# Patient Record
Sex: Female | Born: 1955 | Race: White | Hispanic: No | Smoking: Never smoker
Health system: Southern US, Community
[De-identification: ages and names within clinical notes are randomized; demographics above are authoritative.]

## PROBLEM LIST (undated history)

## (undated) DIAGNOSIS — D219 Benign neoplasm of connective and other soft tissue, unspecified: Secondary | ICD-10-CM

## (undated) DIAGNOSIS — N390 Urinary tract infection, site not specified: Secondary | ICD-10-CM

## (undated) DIAGNOSIS — M1990 Unspecified osteoarthritis, unspecified site: Secondary | ICD-10-CM

## (undated) DIAGNOSIS — S6991XA Unspecified injury of right wrist, hand and finger(s), initial encounter: Secondary | ICD-10-CM

## (undated) DIAGNOSIS — I471 Supraventricular tachycardia, unspecified: Secondary | ICD-10-CM

## (undated) DIAGNOSIS — N329 Bladder disorder, unspecified: Secondary | ICD-10-CM

## (undated) HISTORY — DX: Benign neoplasm of connective and other soft tissue, unspecified: D21.9

## (undated) HISTORY — PX: COLONOSCOPY: SHX174

## (undated) HISTORY — DX: Supraventricular tachycardia: I47.1

## (undated) HISTORY — DX: Urinary tract infection, site not specified: N39.0

## (undated) HISTORY — DX: Supraventricular tachycardia, unspecified: I47.10

---

## 1989-06-06 HISTORY — PX: SALPINGOOPHORECTOMY: SHX82

## 1998-08-27 ENCOUNTER — Other Ambulatory Visit: Admission: RE | Admit: 1998-08-27 | Discharge: 1998-08-27 | Payer: Self-pay | Admitting: Obstetrics and Gynecology

## 1999-10-11 ENCOUNTER — Other Ambulatory Visit: Admission: RE | Admit: 1999-10-11 | Discharge: 1999-10-11 | Payer: Self-pay | Admitting: Obstetrics and Gynecology

## 1999-10-22 ENCOUNTER — Encounter: Payer: Self-pay | Admitting: Obstetrics and Gynecology

## 1999-10-22 ENCOUNTER — Encounter: Admission: RE | Admit: 1999-10-22 | Discharge: 1999-10-22 | Payer: Self-pay | Admitting: Obstetrics and Gynecology

## 2000-11-27 ENCOUNTER — Encounter: Admission: RE | Admit: 2000-11-27 | Discharge: 2000-11-27 | Payer: Self-pay | Admitting: Obstetrics and Gynecology

## 2000-11-27 ENCOUNTER — Encounter: Payer: Self-pay | Admitting: Obstetrics and Gynecology

## 2000-11-30 ENCOUNTER — Encounter: Payer: Self-pay | Admitting: Obstetrics and Gynecology

## 2000-11-30 ENCOUNTER — Encounter: Admission: RE | Admit: 2000-11-30 | Discharge: 2000-11-30 | Payer: Self-pay | Admitting: Obstetrics and Gynecology

## 2001-02-12 ENCOUNTER — Other Ambulatory Visit: Admission: RE | Admit: 2001-02-12 | Discharge: 2001-02-12 | Payer: Self-pay | Admitting: Obstetrics and Gynecology

## 2002-01-24 ENCOUNTER — Encounter: Admission: RE | Admit: 2002-01-24 | Discharge: 2002-01-24 | Payer: Self-pay | Admitting: Obstetrics and Gynecology

## 2002-01-24 ENCOUNTER — Encounter: Payer: Self-pay | Admitting: Obstetrics and Gynecology

## 2002-11-13 ENCOUNTER — Other Ambulatory Visit: Admission: RE | Admit: 2002-11-13 | Discharge: 2002-11-13 | Payer: Self-pay | Admitting: Obstetrics and Gynecology

## 2003-11-05 ENCOUNTER — Encounter: Admission: RE | Admit: 2003-11-05 | Discharge: 2003-11-05 | Payer: Self-pay | Admitting: Obstetrics and Gynecology

## 2004-02-12 ENCOUNTER — Other Ambulatory Visit: Admission: RE | Admit: 2004-02-12 | Discharge: 2004-02-12 | Payer: Self-pay | Admitting: *Deleted

## 2004-05-03 ENCOUNTER — Ambulatory Visit: Payer: Self-pay | Admitting: Family Medicine

## 2005-01-04 ENCOUNTER — Encounter: Admission: RE | Admit: 2005-01-04 | Discharge: 2005-01-04 | Payer: Self-pay | Admitting: *Deleted

## 2005-02-14 ENCOUNTER — Ambulatory Visit: Payer: Self-pay | Admitting: Family Medicine

## 2005-02-17 ENCOUNTER — Ambulatory Visit: Payer: Self-pay | Admitting: Internal Medicine

## 2005-03-01 ENCOUNTER — Ambulatory Visit (HOSPITAL_COMMUNITY): Admission: RE | Admit: 2005-03-01 | Discharge: 2005-03-01 | Payer: Self-pay | Admitting: Urology

## 2005-06-03 ENCOUNTER — Other Ambulatory Visit: Admission: RE | Admit: 2005-06-03 | Discharge: 2005-06-03 | Payer: Self-pay | Admitting: Obstetrics and Gynecology

## 2006-03-14 ENCOUNTER — Encounter: Admission: RE | Admit: 2006-03-14 | Discharge: 2006-03-14 | Payer: Self-pay | Admitting: Obstetrics & Gynecology

## 2006-04-14 ENCOUNTER — Ambulatory Visit (HOSPITAL_COMMUNITY): Admission: RE | Admit: 2006-04-14 | Discharge: 2006-04-14 | Payer: Self-pay | Admitting: Obstetrics & Gynecology

## 2006-04-26 ENCOUNTER — Ambulatory Visit: Payer: Self-pay | Admitting: Family Medicine

## 2006-06-08 ENCOUNTER — Other Ambulatory Visit: Admission: RE | Admit: 2006-06-08 | Discharge: 2006-06-08 | Payer: Self-pay | Admitting: Obstetrics & Gynecology

## 2006-06-23 ENCOUNTER — Ambulatory Visit (HOSPITAL_COMMUNITY): Admission: RE | Admit: 2006-06-23 | Discharge: 2006-06-23 | Payer: Self-pay | Admitting: Gastroenterology

## 2007-02-08 ENCOUNTER — Telehealth: Payer: Self-pay | Admitting: Family Medicine

## 2007-05-01 ENCOUNTER — Encounter: Admission: RE | Admit: 2007-05-01 | Discharge: 2007-05-01 | Payer: Self-pay | Admitting: Obstetrics & Gynecology

## 2007-06-26 ENCOUNTER — Ambulatory Visit: Payer: Self-pay | Admitting: Family Medicine

## 2007-06-26 DIAGNOSIS — Z87448 Personal history of other diseases of urinary system: Secondary | ICD-10-CM | POA: Insufficient documentation

## 2007-06-26 DIAGNOSIS — Z8679 Personal history of other diseases of the circulatory system: Secondary | ICD-10-CM | POA: Insufficient documentation

## 2007-06-26 DIAGNOSIS — Z9189 Other specified personal risk factors, not elsewhere classified: Secondary | ICD-10-CM | POA: Insufficient documentation

## 2007-06-26 DIAGNOSIS — J019 Acute sinusitis, unspecified: Secondary | ICD-10-CM

## 2007-06-29 ENCOUNTER — Other Ambulatory Visit: Admission: RE | Admit: 2007-06-29 | Discharge: 2007-06-29 | Payer: Self-pay | Admitting: Obstetrics & Gynecology

## 2008-07-28 ENCOUNTER — Encounter: Admission: RE | Admit: 2008-07-28 | Discharge: 2008-07-28 | Payer: Self-pay | Admitting: Obstetrics & Gynecology

## 2009-06-24 ENCOUNTER — Telehealth: Payer: Self-pay | Admitting: Family Medicine

## 2009-06-25 ENCOUNTER — Ambulatory Visit: Payer: Self-pay | Admitting: Family Medicine

## 2009-06-25 DIAGNOSIS — J209 Acute bronchitis, unspecified: Secondary | ICD-10-CM

## 2009-08-18 ENCOUNTER — Encounter: Admission: RE | Admit: 2009-08-18 | Discharge: 2009-08-18 | Payer: Self-pay | Admitting: Obstetrics & Gynecology

## 2010-07-06 NOTE — Assessment & Plan Note (Signed)
Summary: chest cold/cough for 2 weeks/per Dr. Karalee Height   Vital Signs:  Patient profile:   55 year old female Weight:      152 pounds Temp:     97.8 degrees F oral Pulse rate:   122 / minute BP sitting:   102 / 68  (left arm) Cuff size:   regular  Vitals Entered By: Alfred Levins, CMA (June 25, 2009 10:58 AM) CC: cough x2 wks   History of Present Illness: Here for 2 weeks of chest congestion, coughing up green sputum, and PND. No fever.   Current Medications (verified): 1)  None  Allergies (verified): 1)  ! Demerol  Past History:  Past Medical History: Reviewed history from 06/26/2007 and no changes required. Mitral valve prolapse (normal ECHO 08-08-02) frequent UTI's , sees Dr. Aldean Ast  Past Surgical History: Reviewed history from 06/26/2007 and no changes required. Colonoscopy-06/23/2006 per Dr. Loreta Ave, normal. Repeat in 5 yrs Caesarean section x2 per Dr. [facility]  Review of Systems  The patient denies anorexia, fever, weight loss, weight gain, vision loss, decreased hearing, hoarseness, chest pain, syncope, dyspnea on exertion, peripheral edema, headaches, hemoptysis, abdominal pain, melena, hematochezia, severe indigestion/heartburn, hematuria, incontinence, genital sores, muscle weakness, suspicious skin lesions, transient blindness, difficulty walking, depression, unusual weight change, abnormal bleeding, enlarged lymph nodes, angioedema, breast masses, and testicular masses.    Physical Exam  General:  Well-developed,well-nourished,in no acute distress; alert,appropriate and cooperative throughout examination Head:  Normocephalic and atraumatic without obvious abnormalities. No apparent alopecia or balding. Eyes:  No corneal or conjunctival inflammation noted. EOMI. Perrla. Funduscopic exam benign, without hemorrhages, exudates or papilledema. Vision grossly normal. Ears:  External ear exam shows no significant lesions or deformities.  Otoscopic  examination reveals clear canals, tympanic membranes are intact bilaterally without bulging, retraction, inflammation or discharge. Hearing is grossly normal bilaterally. Nose:  External nasal examination shows no deformity or inflammation. Nasal mucosa are pink and moist without lesions or exudates. Mouth:  Oral mucosa and oropharynx without lesions or exudates.  Teeth in good repair. Neck:  No deformities, masses, or tenderness noted. Lungs:  rhonchi only   Impression & Recommendations:  Problem # 1:  ACUTE BRONCHITIS (ICD-466.0)  Her updated medication list for this problem includes:    Zithromax Z-pak 250 Mg Tabs (Azithromycin) .Marland Kitchen... As directed  Complete Medication List: 1)  Zithromax Z-pak 250 Mg Tabs (Azithromycin) .... As directed  Patient Instructions: 1)  Please schedule a follow-up appointment as needed .  Prescriptions: ZITHROMAX Z-PAK 250 MG TABS (AZITHROMYCIN) as directed  #1 x 0   Entered and Authorized by:   Nelwyn Salisbury MD   Signed by:   Nelwyn Salisbury MD on 06/25/2009   Method used:   Electronically to        [facility]* (retail)       [address]       [place], [place]  [id number]       Ph: [phone]       Fax: [phone]   RxID:   [id number]

## 2010-07-06 NOTE — Progress Notes (Signed)
Summary: please work in  Phone Note Call from Patient Call back at [phone]   Caller: Patient-live call Summary of Call: wants to be worked in today or tomorrow am. Has a chest cold that won't go away. Initial call taken by: Warnell Forester,  June 24, 2009 12:15 PM  Follow-up for Phone Call        ok for tomorrow am Follow-up by: Nelwyn Salisbury MD,  June 24, 2009 1:50 PM  Additional Follow-up for Phone Call Additional follow up Details #1::        lmoam to return my call. Additional Follow-up by: Warnell Forester,  June 24, 2009 2:23 PM    Additional Follow-up for Phone Call Additional follow up Details #2::    Pt called back and I sch her for ov 06/25/09 at 11:00am as noted above.  Follow-up by: Lucy Antigua,  June 24, 2009 2:41 PM

## 2010-10-11 ENCOUNTER — Other Ambulatory Visit: Payer: Self-pay | Admitting: Obstetrics & Gynecology

## 2010-10-11 DIAGNOSIS — Z1231 Encounter for screening mammogram for malignant neoplasm of breast: Secondary | ICD-10-CM

## 2010-10-15 ENCOUNTER — Ambulatory Visit
Admission: RE | Admit: 2010-10-15 | Discharge: 2010-10-15 | Disposition: A | Discharge status: Home / Self Care | Payer: BC Managed Care – PPO | Source: Ambulatory Visit | Attending: Obstetrics & Gynecology | Admitting: Obstetrics & Gynecology

## 2010-10-15 DIAGNOSIS — Z1231 Encounter for screening mammogram for malignant neoplasm of breast: Secondary | ICD-10-CM

## 2010-10-22 NOTE — Op Note (Signed)
Kimberly Wheeler, PACK                 ACCOUNT NO.:  [id number]   MEDICAL RECORD NO.:  [id number]          PATIENT TYPE:  AMB   LOCATION:  ENDO                         FACILITY:  [facility]   PHYSICIAN:  Anselmo Rod, M.D.  DATE OF BIRTH:  1956/03/09   DATE OF PROCEDURE:  06/23/2006  DATE OF DISCHARGE:                               OPERATIVE REPORT   PROCEDURE PERFORMED:  Screening colonoscopy.   ENDOSCOPIST:  Anselmo Rod, M.D.   INSTRUMENT USED:  Pentax video colonoscope.   INDICATIONS FOR PROCEDURE:  The patient is a 55 year old white female  with family history of colon cancer in a maternal aunt, multiple polyps  in her father and personal history of rectal bleeding undergoing  screening colonoscopy to rule out colonic polyps, masses, etc.   PREPROCEDURE PREPARATION:  Informed consent was procured from the  patient.  The patient was fasted for four hours prior to the procedure  and prepped with 20 OsmoPrep pills the night of the procedure and 12  Osmoprep pills the morning of the procedure.  The risks and benefits of  the procedure including a 10% miss rate for cancer or polyps was  discussed with the patient as well.   PREPROCEDURE PHYSICAL:  The patient had stable vital signs.  Neck  supple.  Chest clear to auscultation.  S1 and S2 regular.  Abdomen soft  with normal bowel sounds.   DESCRIPTION OF PROCEDURE:  The patient was placed in left lateral  decubitus position and sedated with 50 mcg of fentanyl and 5 mg of  Versed in slow incremental doses.  Once the patient was adequately  sedated and maintained on low flow oxygen and continuous cardiac  monitoring, the Olympus video colonoscope was advanced from the rectum  to the cecum without difficulty.  The patient had a fairly good prep.  There was some residual stool in the right colon and multiple washes  were done.  No masses, polyps, erosions, ulcerations or diverticula were  seen.  Small internal hemorrhoids were  appreciated on retroflexion in  the rectum.  The patient tolerated the procedure well without  complication.   IMPRESSION:  Normal colonoscopy up to the terminal ileum except for  small nonbleeding internal hemorrhoids.  No masses, polyps or  diverticula seen.   RECOMMENDATIONS:  1. Continue high fiber diet with liberal fluid intake.  2. Repeat colonoscopy in the next five years unless the patient has      any abnormal symptoms in the interim.  3. Outpatient followup as need arises in the future.  4. If the patient's colonoscopy is normal in five years, repeat      colonoscopy will be recommended every 10 years thereafter.      Anselmo Rod, M.D.  Electronically Signed     JNM/MEDQ  D:  06/23/2006  T:  06/23/2006  Job:  [id number]   cc:   M. Leda Quail, MD  Tera Mater. Clent Ridges, MD  Courtney Paris, M.D.

## 2011-06-02 ENCOUNTER — Emergency Department (HOSPITAL_COMMUNITY)
Admission: EM | Admit: 2011-06-02 | Discharge: 2011-06-02 | Discharge status: Against Medical Advice | Payer: BC Managed Care – PPO

## 2011-06-03 ENCOUNTER — Other Ambulatory Visit (HOSPITAL_COMMUNITY): Payer: Self-pay | Admitting: Urology

## 2011-06-03 DIAGNOSIS — N135 Crossing vessel and stricture of ureter without hydronephrosis: Secondary | ICD-10-CM

## 2011-06-08 ENCOUNTER — Encounter (HOSPITAL_COMMUNITY)
Admission: RE | Admit: 2011-06-08 | Discharge: 2011-06-08 | Disposition: A | Discharge status: Home / Self Care | Payer: BC Managed Care – PPO | Source: Ambulatory Visit | Attending: Urology | Admitting: Urology

## 2011-06-08 DIAGNOSIS — N135 Crossing vessel and stricture of ureter without hydronephrosis: Secondary | ICD-10-CM | POA: Insufficient documentation

## 2011-06-08 MED ORDER — FUROSEMIDE 10 MG/ML IJ SOLN
40.0000 mg | Freq: Once | INTRAMUSCULAR | Status: DC
  Filled 2011-06-08: qty 4

## 2011-06-08 MED ORDER — TECHNETIUM TC 99M MERTIATIDE
15.0000 | Freq: Once | INTRAVENOUS | Status: AC | PRN
  Administered 2011-06-08: 15 via INTRAVENOUS

## 2011-06-10 ENCOUNTER — Other Ambulatory Visit: Payer: Self-pay | Admitting: Urology

## 2011-06-13 ENCOUNTER — Encounter (HOSPITAL_BASED_OUTPATIENT_CLINIC_OR_DEPARTMENT_OTHER): Payer: Self-pay | Admitting: *Deleted

## 2011-06-13 NOTE — Progress Notes (Signed)
NPO AFTER MN. ARRIVES AGO 0830. NEEDS HG. (ASK MD ?EKG).

## 2011-06-15 ENCOUNTER — Encounter (HOSPITAL_BASED_OUTPATIENT_CLINIC_OR_DEPARTMENT_OTHER): Payer: Self-pay | Admitting: Anesthesiology

## 2011-06-15 ENCOUNTER — Encounter (HOSPITAL_BASED_OUTPATIENT_CLINIC_OR_DEPARTMENT_OTHER)
Admission: RE | Disposition: A | Discharge status: Home / Self Care | Payer: Self-pay | Source: Ambulatory Visit | Attending: Urology

## 2011-06-15 ENCOUNTER — Other Ambulatory Visit: Payer: Self-pay | Admitting: Urology

## 2011-06-15 ENCOUNTER — Ambulatory Visit (HOSPITAL_BASED_OUTPATIENT_CLINIC_OR_DEPARTMENT_OTHER): Payer: BC Managed Care – PPO | Admitting: Anesthesiology

## 2011-06-15 ENCOUNTER — Ambulatory Visit (HOSPITAL_BASED_OUTPATIENT_CLINIC_OR_DEPARTMENT_OTHER)
Admission: RE | Admit: 2011-06-15 | Discharge: 2011-06-15 | Disposition: A | Discharge status: Home / Self Care | Payer: BC Managed Care – PPO | Source: Ambulatory Visit | Attending: Urology | Admitting: Urology

## 2011-06-15 ENCOUNTER — Encounter (HOSPITAL_BASED_OUTPATIENT_CLINIC_OR_DEPARTMENT_OTHER): Payer: Self-pay | Admitting: *Deleted

## 2011-06-15 DIAGNOSIS — R232 Flushing: Secondary | ICD-10-CM | POA: Insufficient documentation

## 2011-06-15 DIAGNOSIS — R300 Dysuria: Secondary | ICD-10-CM | POA: Insufficient documentation

## 2011-06-15 DIAGNOSIS — Z79899 Other long term (current) drug therapy: Secondary | ICD-10-CM | POA: Insufficient documentation

## 2011-06-15 DIAGNOSIS — R61 Generalized hyperhidrosis: Secondary | ICD-10-CM | POA: Insufficient documentation

## 2011-06-15 DIAGNOSIS — N329 Bladder disorder, unspecified: Secondary | ICD-10-CM | POA: Insufficient documentation

## 2011-06-15 DIAGNOSIS — M549 Dorsalgia, unspecified: Secondary | ICD-10-CM | POA: Insufficient documentation

## 2011-06-15 HISTORY — DX: Unspecified injury of right wrist, hand and finger(s), initial encounter: S69.91XA

## 2011-06-15 HISTORY — PX: CYSTOSCOPY WITH BIOPSY: SHX5122

## 2011-06-15 HISTORY — DX: Bladder disorder, unspecified: N32.9

## 2011-06-15 HISTORY — PX: CYSTOSCOPY W/ RETROGRADES: SHX1426

## 2011-06-15 HISTORY — DX: Unspecified osteoarthritis, unspecified site: M19.90

## 2011-06-15 SURGERY — CYSTOSCOPY, WITH BIOPSY
Anesthesia: General | Site: Ureter | Wound class: Clean Contaminated

## 2011-06-15 MED ORDER — PHENAZOPYRIDINE HCL 100 MG PO TABS: 100.0000 mg | ORAL_TABLET | Freq: Three times a day (TID) | ORAL | Status: AC | PRN

## 2011-06-15 MED ORDER — BELLADONNA ALKALOIDS-OPIUM 16.2-60 MG RE SUPP
RECTAL | Status: DC | PRN
  Administered 2011-06-15: 1 via RECTAL

## 2011-06-15 MED ORDER — OXYCODONE-ACETAMINOPHEN 5-325 MG PO TABS: 1.0000 | ORAL_TABLET | ORAL | Status: AC | PRN

## 2011-06-15 MED ORDER — STERILE WATER FOR IRRIGATION IR SOLN
Status: DC | PRN
  Administered 2011-06-15: 1 mL

## 2011-06-15 MED ORDER — LIDOCAINE HCL 2 % EX GEL
CUTANEOUS | Status: DC | PRN
  Administered 2011-06-15: 1

## 2011-06-15 MED ORDER — ONDANSETRON HCL 4 MG/2ML IJ SOLN
INTRAMUSCULAR | Status: DC | PRN
  Administered 2011-06-15: 4 mg via INTRAVENOUS

## 2011-06-15 MED ORDER — LACTATED RINGERS IV SOLN
INTRAVENOUS | Status: DC
  Administered 2011-06-15 (×2): via INTRAVENOUS

## 2011-06-15 MED ORDER — PROMETHAZINE HCL 25 MG/ML IJ SOLN: 6.2500 mg | INTRAMUSCULAR | Status: DC | PRN

## 2011-06-15 MED ORDER — SENNOSIDES-DOCUSATE SODIUM 8.6-50 MG PO TABS: 1.0000 | ORAL_TABLET | Freq: Two times a day (BID) | ORAL | Status: AC

## 2011-06-15 MED ORDER — LIDOCAINE HCL (CARDIAC) 20 MG/ML IV SOLN
INTRAVENOUS | Status: DC | PRN
  Administered 2011-06-15: 80 mg via INTRAVENOUS

## 2011-06-15 MED ORDER — IOHEXOL 350 MG/ML SOLN
INTRAVENOUS | Status: DC | PRN
  Administered 2011-06-15: 50 mL via INTRAVENOUS

## 2011-06-15 MED ORDER — FENTANYL CITRATE 0.05 MG/ML IJ SOLN: 25.0000 ug | INTRAMUSCULAR | Status: DC | PRN

## 2011-06-15 MED ORDER — FENTANYL CITRATE 0.05 MG/ML IJ SOLN
INTRAMUSCULAR | Status: DC | PRN
  Administered 2011-06-15 (×2): 50 ug via INTRAVENOUS

## 2011-06-15 MED ORDER — OXYBUTYNIN CHLORIDE 5 MG PO TABS: 5.0000 mg | ORAL_TABLET | Freq: Four times a day (QID) | ORAL | Status: DC | PRN

## 2011-06-15 MED ORDER — HYOSCYAMINE SULFATE 0.125 MG PO TABS: 0.1250 mg | ORAL_TABLET | ORAL | Status: AC | PRN

## 2011-06-15 MED ORDER — CEFAZOLIN SODIUM 1-5 GM-% IV SOLN: 1.0000 g | INTRAVENOUS | Status: DC

## 2011-06-15 MED ORDER — PROPOFOL 10 MG/ML IV EMUL
INTRAVENOUS | Status: DC | PRN
  Administered 2011-06-15: 180 mg via INTRAVENOUS

## 2011-06-15 MED ORDER — EPHEDRINE SULFATE 50 MG/ML IJ SOLN
INTRAMUSCULAR | Status: DC | PRN
  Administered 2011-06-15: 10 mg via INTRAVENOUS

## 2011-06-15 SURGICAL SUPPLY — 42 items
ADAPTER CATH URET PLST 4-6FR (CATHETERS) ×1 IMPLANT
ADPR CATH URET STRL DISP 4-6FR (CATHETERS) ×2
BAG DRAIN URO-CYSTO SKYTR STRL (DRAIN) ×3 IMPLANT
BAG DRN UROCATH (DRAIN) ×2
BASKET LASER NITINOL 1.9FR (BASKET) IMPLANT
BASKET STNLS GEMINI 4WIRE 3FR (BASKET) IMPLANT
BASKET ZERO TIP NITINOL 2.4FR (BASKET) IMPLANT
BRUSH URET BIOPSY 3F (UROLOGICAL SUPPLIES) IMPLANT
BSKT STON RTRVL 120 1.9FR (BASKET)
BSKT STON RTRVL GEM 120X11 3FR (BASKET)
BSKT STON RTRVL ZERO TP 2.4FR (BASKET)
CANISTER SUCT LVC 12 LTR MEDI- (MISCELLANEOUS) IMPLANT
CATH INTERMIT  6FR 70CM (CATHETERS) IMPLANT
CATH URET 5FR 28IN CONE TIP (BALLOONS)
CATH URET 5FR 28IN OPEN ENDED (CATHETERS) ×1 IMPLANT
CATH URET 5FR 70CM CONE TIP (BALLOONS) IMPLANT
CLOTH BEACON ORANGE TIMEOUT ST (SAFETY) ×3 IMPLANT
DRAPE CAMERA CLOSED 9X96 (DRAPES) ×3 IMPLANT
ELECT REM PT RETURN 9FT ADLT (ELECTROSURGICAL) ×3
ELECTRODE REM PT RTRN 9FT ADLT (ELECTROSURGICAL) ×2 IMPLANT
GLOVE BIO SURGEON STRL SZ 6.5 (GLOVE) ×1 IMPLANT
GLOVE ECLIPSE 6.5 STRL STRAW (GLOVE) ×1 IMPLANT
GLOVE ECLIPSE 7.0 STRL STRAW (GLOVE) ×3 IMPLANT
GLOVE INDICATOR 7.5 STRL GRN (GLOVE) ×3 IMPLANT
GOWN PREVENTION PLUS LG XLONG (DISPOSABLE) ×3 IMPLANT
GOWN STRL REIN XL XLG (GOWN DISPOSABLE) ×3 IMPLANT
GUIDEWIRE 0.038 PTFE COATED (WIRE) IMPLANT
GUIDEWIRE ANG ZIPWIRE 038X150 (WIRE) IMPLANT
GUIDEWIRE STR DUAL SENSOR (WIRE) ×3 IMPLANT
IV NS IRRIG 3000ML ARTHROMATIC (IV SOLUTION) ×3 IMPLANT
KIT BALLIN UROMAX 15FX10 (LABEL) IMPLANT
KIT BALLN UROMAX 15FX4 (MISCELLANEOUS) IMPLANT
KIT BALLN UROMAX 26 75X4 (MISCELLANEOUS)
LASER FIBER DISP (UROLOGICAL SUPPLIES) IMPLANT
NEEDLE HYPO 22GX1.5 SAFETY (NEEDLE) IMPLANT
NS IRRIG 500ML POUR BTL (IV SOLUTION) IMPLANT
PACK CYSTOSCOPY (CUSTOM PROCEDURE TRAY) ×3 IMPLANT
SET HIGH PRES BAL DIL (LABEL)
SHEATH URET ACCESS 12FR/35CM (UROLOGICAL SUPPLIES) IMPLANT
SHEATH URET ACCESS 12FR/55CM (UROLOGICAL SUPPLIES) IMPLANT
SYRINGE IRR TOOMEY STRL 70CC (SYRINGE) IMPLANT
WATER STERILE IRR 3000ML UROMA (IV SOLUTION) ×3 IMPLANT

## 2011-06-15 NOTE — Anesthesia Preprocedure Evaluation (Signed)
Anesthesia Evaluation  Patient identified by MRN, date of birth, ID band Patient awake    Reviewed: Allergy & Precautions, H&P , NPO status , Patient's Chart, lab work & pertinent test results  Airway Mallampati: II TM Distance: >3 FB Neck ROM: Full    Dental No notable dental hx.    Pulmonary neg pulmonary ROS,  clear to auscultation  Pulmonary exam normal       Cardiovascular neg cardio ROS Regular Normal    Neuro/Psych Negative Neurological ROS  Negative Psych ROS   GI/Hepatic negative GI ROS, Neg liver ROS,   Endo/Other  Negative Endocrine ROS  Renal/GU negative Renal ROS  Genitourinary negative   Musculoskeletal negative musculoskeletal ROS (+)   Abdominal   Peds negative pediatric ROS (+)  Hematology negative hematology ROS (+)   Anesthesia Other Findings   Reproductive/Obstetrics negative OB ROS                           Anesthesia Physical Anesthesia Plan  ASA: I  Anesthesia Plan: General   Post-op Pain Management:    Induction: Intravenous  Airway Management Planned: LMA  Additional Equipment:   Intra-op Plan:   Post-operative Plan: Extubation in OR  Informed Consent: I have reviewed the patients History and Physical, chart, labs and discussed the procedure including the risks, benefits and alternatives for the proposed anesthesia with the patient or authorized representative who has indicated his/her understanding and acceptance.   Dental advisory given  Plan Discussed with: CRNA  Anesthesia Plan Comments:         Anesthesia Quick Evaluation  

## 2011-06-15 NOTE — Transfer of Care (Signed)
Immediate Anesthesia Transfer of Care Note  Patient: Kimberly Wheeler  Procedure(s) Performed:  CYSTOSCOPY WITH BIOPSY - BLADDER BIOPSY C-ARM CAMERA  ; CYSTOSCOPY WITH RETROGRADE PYELOGRAM - (BIL) RPG  Patient Location: Patient transported to PACU with oxygen via face mask at 6 Liters / Min  Anesthesia Type: General  Level of Consciousness: awake and alert   Airway & Oxygen Therapy: Patient Spontanous Breathing and Patient connected to face mask oxygen Post-op Assessment: Report given to PACU RN and Post -op Vital signs reviewed and stable  Post vital signs: Reviewed and stable  Complications: No apparent anesthesia complications

## 2011-06-15 NOTE — Anesthesia Postprocedure Evaluation (Signed)
  Anesthesia Post-op Note  Patient: Kimberly Wheeler  Procedure(s) Performed:  CYSTOSCOPY WITH BIOPSY - BLADDER BIOPSY C-ARM CAMERA  ; CYSTOSCOPY WITH RETROGRADE PYELOGRAM - (BIL) RPG  Patient Location: PACU  Anesthesia Type: General  Level of Consciousness: awake and alert   Airway and Oxygen Therapy: Patient Spontanous Breathing  Post-op Pain: mild  Post-op Assessment: Post-op Vital signs reviewed, Patient's Cardiovascular Status Stable, Respiratory Function Stable, Patent Airway and No signs of Nausea or vomiting  Post-op Vital Signs: stable  Complications: No apparent anesthesia complications

## 2011-06-15 NOTE — H&P (Signed)
Chief Complaint  Lower urinary tract symptoms.    History of Present Illness  55 year old female.  She has dysuria and frequency. An office cystoscopy 06/02/11 revealed a mild amount of erythema at the 6:00 position of the bladder neck. No other tumors or abnormalities are noted inside the bladder. I recommended cystoscopy with biopsy of this area as well as mapping bladder biopsies due to her history of recurrent symptoms. We have discussed the risks, benefits, alternatives, and likelihood of achieving goals. She presents today for that procedure. Past Medical History Problems  1. History of  Acute Bacterial Pyelonephritis Right 590.10 2. History of  Anxiety (Symptom) 300.00 3. History of  Microscopic Hematuria 599.72 4. 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 History of  Urethritis 597.80  Surgical History Problems  1. History of  Cesarean Section 2. History of  Oophorectomy V45.77  Current Meds 1. Ciprofloxacin HCl 500 MG Oral Tablet; Take 1 tablet twice daily; Therapy: 10Dec2012 to  (Evaluate:17Dec2012)  Requested for: 10Dec2012; Last Rx:10Dec2012 2. Estroven Oral Tablet; Therapy: (Recorded:10Dec2012) to 3. Ibuprofen CAPS; Therapy: (Recorded:23Sep2008) to 4. Naproxen 500 MG Oral Tablet; Therapy: 08Sep2010 to  Allergies Medication  1. Macrodantin CAPS  Family History Problems  1. Paternal history of  Family Health Status Number Of Children 2 sons 2. Paternal history of  Prostate Cancer V16.42  Social History Problems  1. Being A Social Drinker 2. Marital History - Currently Married 3. Never A Smoker 4. Occupation: Scientist, research (physical sciences) Denied  5. Caffeine Use  Review of Systems Constitutional, skin, eye, otolaryngeal, hematologic/lymphatic, cardiovascular, pulmonary, endocrine, musculoskeletal, gastrointestinal, neurological and psychiatric system(s) were reviewed and pertinent findings if present are noted.  Constitutional: night sweats.  Endocrine: hot  flashes.  Musculoskeletal: back pain.     Physical Exam Constitutional: Well nourished and well developed.  ENT:. The ears and nose are normal in appearance. The oropharynx is normal.  Neck: The appearance of the neck is normal and no neck mass is present.  Pulmonary: No respiratory distress and normal respiratory rhythm and effort.  Cardiovascular: Heart rate and rhythm are normal . No peripheral edema.  Abdomen: The abdomen is soft and nontender. The abdomen is no rebound. No CVA tenderness.  Lymphatics: The posterior cervical and supraclavicular nodes are not enlarged or tender.  Skin: Normal skin turgor and no visible rash.  Neuro/Psych:. Mood and affect are appropriate. No focal sensory deficits.     Assessment Dysuria  Urinary Frequency  Bladder Neoplasm Of Uncertain Behavior   Plan To OR for cystoscopy, bladder biopsy, and bilateral retrograde pyelogram.

## 2011-06-15 NOTE — Brief Op Note (Signed)
06/15/2011  9:52 AM  PATIENT:  Kimberly Wheeler  56 y.o. female  PRE-OPERATIVE DIAGNOSIS:  Bladder Lesion  POST-OPERATIVE DIAGNOSIS:  Bladder Lesion  PROCEDURE:  Procedure(s): CYSTOSCOPY Bladder BIOPSY Fluoroscopy Bilateral RETROGRADE PYELOGRAM  SURGEON:  Surgeon(s): Milford Cage, MD  PHYSICIAN ASSISTANT: None  ASSISTANTS: none   ANESTHESIA:   general  EBL:  Total I/O In: 100 [I.V.:100] Out: -   BLOOD ADMINISTERED:none  DRAINS: none   LOCAL MEDICATIONS USED:  OTHER Lidocaine jell 10 cc; B&O suppository  SPECIMEN:  Source of Specimen:  bladder  DISPOSITION OF SPECIMEN:  PATHOLOGY  COUNTS:  YES  TOURNIQUET:  * No tourniquets in log *  DICTATION: .Other Dictation: Dictation Number [id number]  PLAN OF CARE: Discharge to home after PACU  PATIENT DISPOSITION:  PACU - hemodynamically stable.   Delay start of Pharmacological VTE agent (>24hrs) due to surgical blood loss or risk of bleeding: No

## 2011-06-15 NOTE — Anesthesia Procedure Notes (Addendum)
Procedure Name: LMA Insertion Date/Time: 06/15/2011 9:27 AM Performed by: Lorrin Jackson Pre-anesthesia Checklist: Patient identified, Emergency Drugs available, Suction available and Patient being monitored Patient Re-evaluated:Patient Re-evaluated prior to inductionOxygen Delivery Method: Circle System Utilized Preoxygenation: Pre-oxygenation with 100% oxygen Intubation Type: IV induction Ventilation: Mask ventilation without difficulty LMA: LMA with gastric port inserted LMA Size: 4.0 Number of attempts: 1 Placement Confirmation: positive ETCO2 Tube secured with: Tape Dental Injury: Teeth and Oropharynx as per pre-operative assessment

## 2011-06-16 ENCOUNTER — Encounter (HOSPITAL_BASED_OUTPATIENT_CLINIC_OR_DEPARTMENT_OTHER): Payer: Self-pay | Admitting: Urology

## 2011-06-16 NOTE — Op Note (Signed)
NAME:  CARAN, STORCK NO.:  MEDICAL RECORD NO.:  [id number]  LOCATION:                                 FACILITY:  PHYSICIAN:  Natalia Leatherwood, MD    DATE OF BIRTH:  Aug 05, 1955  DATE OF PROCEDURE:  06/15/2011 DATE OF DISCHARGE:                              OPERATIVE REPORT   ASSISTANT:  None.  PREOPERATIVE DIAGNOSES: 1. Erythematous bladder lesion. 2. Dysuria.  POSTOPERATIVE DIAGNOSES: 1. Erythematous bladder lesion. 2. Dysuria.  PROCEDURES PERFORMED: 1. Cystoscopy. 2. Panendoscopy. 3. Bilateral retrograde pyelogram. 4. Fluoroscopy. 5. Bladder biopsy of erythematous area at 6 o'clock on the bladder     neck. 6. Mapping bladder biopsies.  FINDINGS:  Erythematous area at 6 o'clock on the bladder neck.  No filling defects on bilateral retrograde pyelograms.  SPECIMEN:  Four bladder biopsies taken, sent for permanent pathology.  COMPLICATIONS:  None.  BLOOD LOSS:  Minimal.  DRAINS:  None.  HISTORY OF PRESENT ILLNESS:  This is a 56 year old female who has lower urinary tract symptoms.  Office cystoscopy revealed an erythematous area at the bladder neck.  I recommended biopsy of this site along with mapping bladder biopsies and retrograde pyelograms to ensure there is no surgical reason for her urinary symptoms.  The patient presented today for that procedure.  PROCEDURE:  Informed consent was obtained.  The patient was taken to the operating room, where she was placed in supine position.  IV antibiotics were infused and general anesthesia with induced.  She was placed in dorsal lithotomy position, making sure to pad all pertinent neurovascular pressure points appropriately.  Her genitals were then prepped and draped in usual sterile fashion.  A time-out was performed, in which the correct patient, surgical site, and procedure were identified and agreed upon by the team.  Next, a 30-degree cystoscope was advanced through the urethra into  the bladder.  The bladder was evaluated in systematic fashion with a 30-degree and 70-degree lens. There was erythema at the bladder neck, but no other tumors or lesions anywhere else in the bladder.  Bilateral retrograde pyelograms were obtained by cannulating first the left ureteral orifice with a 5-French open-ended ureteral Pollock catheter.  This showed no filling defects in the collecting system or ureter.  This was removed and there was good emptying of the left side.  Attention turned to the right side, which was cannulated with a 5-French open-ended Pollock catheter, and also this emptied out well with no filling defects and no lesions noted. Cold cup biopsy forceps were used to biopsy the 6 o'clock position on the bladder neck and then three other positions in the bladder.  The right lateral bladder wall, the left lateral bladder wall, and the posterior bladder dome as mapping bladder biopsies.  These were all sent separately for permanent pathology.  Bugbee electrode was used to fulgurate these biopsy sites.  B and O Suppositories were placed in the patient's rectum and the bladder was emptied and 10 mL of lidocaine jelly was placed into the urethra.  She has placed back in supine position.  Anesthesia was reversed.  She was taken to PACU in  stable condition.          ______________________________ Natalia Leatherwood, MD     DW/MEDQ  D:  06/15/2011  T:  06/16/2011  Job:  [id number]

## 2011-10-19 ENCOUNTER — Other Ambulatory Visit: Payer: Self-pay | Admitting: Obstetrics & Gynecology

## 2011-10-19 DIAGNOSIS — Z1231 Encounter for screening mammogram for malignant neoplasm of breast: Secondary | ICD-10-CM

## 2011-11-17 ENCOUNTER — Ambulatory Visit
Admission: RE | Admit: 2011-11-17 | Discharge: 2011-11-17 | Disposition: A | Discharge status: Home / Self Care | Payer: BC Managed Care – PPO | Source: Ambulatory Visit | Attending: Obstetrics & Gynecology | Admitting: Obstetrics & Gynecology

## 2011-11-17 DIAGNOSIS — Z1231 Encounter for screening mammogram for malignant neoplasm of breast: Secondary | ICD-10-CM

## 2013-02-05 ENCOUNTER — Other Ambulatory Visit: Payer: Self-pay

## 2013-02-05 DIAGNOSIS — Z1231 Encounter for screening mammogram for malignant neoplasm of breast: Secondary | ICD-10-CM

## 2013-02-21 ENCOUNTER — Ambulatory Visit
Admission: RE | Admit: 2013-02-21 | Discharge: 2013-02-21 | Disposition: A | Discharge status: Home / Self Care | Payer: BC Managed Care – PPO | Source: Ambulatory Visit

## 2013-02-21 DIAGNOSIS — Z1231 Encounter for screening mammogram for malignant neoplasm of breast: Secondary | ICD-10-CM

## 2013-05-07 ENCOUNTER — Ambulatory Visit (INDEPENDENT_AMBULATORY_CARE_PROVIDER_SITE_OTHER): Payer: BC Managed Care – PPO | Admitting: Gynecology

## 2013-05-07 ENCOUNTER — Encounter: Payer: Self-pay | Admitting: Gynecology

## 2013-05-07 VITALS — BP 120/68 | HR 70 | Resp 12 | Ht 65.75 in | Wt 154.0 lb

## 2013-05-07 DIAGNOSIS — Z01419 Encounter for gynecological examination (general) (routine) without abnormal findings: Secondary | ICD-10-CM

## 2013-05-07 NOTE — Progress Notes (Signed)
57 y.o. Married Caucasian female   G2P2002 here for annual exam. Pt reports menses are absent.  She does report hot flashes, does have night sweats, does have vaginal dryness.  She is using lubricants, natural lubricant.  She does not report post-menopasual bleeding.  No longer using I cool for hot flashes, tolerable. Using aloe gel for vaginal lubricant and UTI's have resolved.  Patient's last menstrual period was 03/07/2011.          Sexually active: yes  The current method of family planning is post menopausal status and Tubal Ligation.    Exercising: yes  walk everyday Last pap: 05/07/12 Neg HR HPV Abnormal PAP: No Mammogram: 9/14  BSE: occassionally Colonoscopy: 06/2006 f/u 5 years  DEXA: none Alcohol: 3 drinks/wk Tobacco: no  Hgb: PCP ; Urine: Urologist    Health Maintenance  Topic Date Due  . Influenza Vaccine  01/04/2013  . Mammogram  02/22/2015  . Pap Smear  05/08/2015  . Colonoscopy  06/06/2016  . Tetanus/tdap  06/06/2017    Family History  Problem Relation Age of Onset  . Osteoporosis Mother   . Prostate cancer Father   . Diabetes Maternal Grandmother   . Heart attack Maternal Grandmother     Patient Active Problem List   Diagnosis Date Noted  . ACUTE BRONCHITIS 06/25/2009  . ACUTE SINUSITIS, UNSPECIFIED 06/26/2007  . 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 UTI'S, HX OF 06/26/2007  . CHICKENPOX, HX OF 06/26/2007    Past Medical History  Diagnosis Date  . Lesion of bladder   . Arthritis     fingers  . Injury of fifth finger of right hand     Past Surgical History  Procedure Laterality Date  . Salpingoophorectomy  1991    LEFT  . Cystoscopy with biopsy  06/15/2011    Procedure: CYSTOSCOPY WITH BIOPSY;  Surgeon: Milford Cage, MD;  Location: [facility];  Service: Urology;  Laterality: N/A;  BLADDER BIOPSY C-ARM CAMERA    . Cystoscopy w/ retrogrades  06/15/2011    Procedure: CYSTOSCOPY WITH RETROGRADE PYELOGRAM;  Surgeon:  Milford Cage, MD;  Location: [facility];  Service: Urology;  Laterality: Bilateral;  (BIL) RPG  . Cesarean section  1986  . Cesarean section w/btl  1988    Allergies: Darvon and Macrodantin  Current Outpatient Prescriptions  Medication Sig Dispense Refill  . B Complex Vitamins (VITAMIN B COMPLEX PO) Take by mouth.      . Nutritional Supplements (ESTROVEN PO) Take 1 tablet by mouth daily.        Marland Kitchen oxybutynin (DITROPAN) 5 MG tablet Take 1 tablet (5 mg total) by mouth every 6 (six) hours as needed.  40 tablet  4  . TRIMETHOPRIM PO Take by mouth as needed.      Marland Kitchen UNABLE TO FIND daily. Med Name: I-Cool      . Vitamins A & D 5000-400 UNITS CAPS Take by mouth.       No current facility-administered medications for this visit.    ROS: Pertinent items are noted in HPI.  Exam:    BP 120/68  Pulse 70  Resp 12  Ht 5' 5.75" (1.67 m)  Wt 154 lb (69.854 kg)  BMI 25.05 kg/m2  LMP 03/07/2011 Weight change: @WEIGHTCHANGE @ Last 3 height recordings:  Ht Readings from Last 3 Encounters:  05/07/13 5' 5.75" (1.67 m)  06/13/11 5\' 6"  (1.676 m)  06/13/11 5\' 6"  (1.676 m)   General appearance: alert,  cooperative and appears stated age Head: Normocephalic, without obvious abnormality, atraumatic Neck: no adenopathy, no carotid bruit, no JVD, supple, symmetrical, trachea midline and thyroid not enlarged, symmetric, no tenderness/mass/nodules Lungs: clear to auscultation bilaterally Breasts: normal appearance, no masses or tenderness, fibrocystic right greater than left Heart: regular rate and rhythm, S1, S2 normal, no murmur, click, rub or gallop Abdomen: soft, non-tender; bowel sounds normal; no masses,  no organomegaly Extremities: extremities normal, atraumatic, no cyanosis or edema Skin: Skin color, texture, turgor normal. No rashes or lesions Lymph nodes: Cervical, supraclavicular, and axillary nodes normal. no inguinal nodes palpated Neurologic: Grossly  normal   Pelvic: External genitalia:  no lesions              Urethra: normal appearing urethra with no masses, tenderness or lesions              Bartholins and Skenes: normal                 Vagina: normal appearing vagina with normal color and discharge, no lesions              Cervix: normal appearance              Pap taken: no        Bimanual Exam:  Uterus:  uterus is normal size, shape, consistency and nontender                                      Adnexa:    normal adnexa in size, nontender and no masses                                      Rectovaginal: Confirms                                      Anus:  normal sphincter tone, no lesions  A: well woman menopause     P: mammogram annual pap smear  Not done today counseled on breast self exam, mammography screening, menopause, adequate intake of calcium and vitamin D, diet and exercise return annually or prn Discussed PAP guideline changes, importance of weight bearing exercises, calcium, vit D and balanced diet.  An After Visit Summary was printed and given to the patient.

## 2013-05-07 NOTE — Patient Instructions (Signed)

## 2013-05-08 ENCOUNTER — Ambulatory Visit: Payer: Self-pay | Admitting: Gynecology

## 2013-11-04 ENCOUNTER — Other Ambulatory Visit (INDEPENDENT_AMBULATORY_CARE_PROVIDER_SITE_OTHER): Payer: BC Managed Care – PPO

## 2013-11-04 DIAGNOSIS — Z0000 Encounter for general adult medical examination without abnormal findings: Secondary | ICD-10-CM

## 2013-11-04 LAB — BASIC METABOLIC PANEL
BUN: 13 mg/dL (ref 6–23)
CALCIUM: 9.5 mg/dL (ref 8.4–10.5)
CO2: 27 mEq/L (ref 19–32)
Chloride: 104 mEq/L (ref 96–112)
Creatinine, Ser: 0.8 mg/dL (ref 0.4–1.2)
GFR: 83.47 mL/min (ref >60.00)
Glucose, Bld: 90 mg/dL (ref 70–99)
POTASSIUM: 4 meq/L (ref 3.5–5.1)
SODIUM: 140 meq/L (ref 135–145)

## 2013-11-04 LAB — CBC WITH DIFFERENTIAL/PLATELET
BASOS ABS: 0.1 10*3/uL (ref 0.0–0.1)
BASOS PCT: 1.4 % (ref 0.0–3.0)
EOS ABS: 0.1 10*3/uL (ref 0.0–0.7)
Eosinophils Relative: 2.1 % (ref 0.0–5.0)
HEMATOCRIT: 43.1 % (ref 36.0–46.0)
HEMOGLOBIN: 14.4 g/dL (ref 12.0–15.0)
LYMPHS PCT: 24.5 % (ref 12.0–46.0)
Lymphs Abs: 1.1 10*3/uL (ref 0.7–4.0)
MCHC: 33.3 g/dL (ref 30.0–36.0)
MCV: 89.4 fl (ref 78.0–100.0)
MONO ABS: 0.3 10*3/uL (ref 0.1–1.0)
Monocytes Relative: 6.3 % (ref 3.0–12.0)
Neutro Abs: 3 10*3/uL (ref 1.4–7.7)
Neutrophils Relative %: 65.7 % (ref 43.0–77.0)
PLATELETS: 227 10*3/uL (ref 150.0–400.0)
RBC: 4.82 Mil/uL (ref 3.87–5.11)
RDW: 13.1 % (ref 11.5–15.5)
WBC: 4.6 10*3/uL (ref 4.0–10.5)

## 2013-11-04 LAB — HEPATIC FUNCTION PANEL
ALT: 15 U/L (ref 0–35)
AST: 20 U/L (ref 0–37)
Albumin: 4.3 g/dL (ref 3.5–5.2)
Alkaline Phosphatase: 48 U/L (ref 39–117)
BILIRUBIN DIRECT: 0.1 mg/dL (ref 0.0–0.3)
BILIRUBIN TOTAL: 0.7 mg/dL (ref 0.2–1.2)
Total Protein: 7.4 g/dL (ref 6.0–8.3)

## 2013-11-04 LAB — POCT URINALYSIS DIPSTICK
BILIRUBIN UA: NEGATIVE
GLUCOSE UA: NEGATIVE
Ketones, UA: NEGATIVE
LEUKOCYTES UA: NEGATIVE
NITRITE UA: NEGATIVE
Protein, UA: NEGATIVE
Spec Grav, UA: 1.01
Urobilinogen, UA: 0.2
pH, UA: 6

## 2013-11-04 LAB — LIPID PANEL
CHOL/HDL RATIO: 3
Cholesterol: 242 mg/dL — ABNORMAL HIGH (ref 0–200)
HDL: 95.2 mg/dL (ref >39.00)
LDL Cholesterol: 135 mg/dL — ABNORMAL HIGH (ref 0–99)
TRIGLYCERIDES: 60 mg/dL (ref 0.0–149.0)
VLDL: 12 mg/dL (ref 0.0–40.0)

## 2013-11-04 LAB — TSH: TSH: 1.76 u[IU]/mL (ref 0.35–4.50)

## 2013-11-11 ENCOUNTER — Encounter: Payer: Self-pay | Admitting: Family Medicine

## 2013-11-11 ENCOUNTER — Ambulatory Visit (INDEPENDENT_AMBULATORY_CARE_PROVIDER_SITE_OTHER): Payer: BC Managed Care – PPO | Admitting: Family Medicine

## 2013-11-11 VITALS — BP 132/63 | HR 77 | Temp 98.9°F | Ht 66.0 in | Wt 156.0 lb

## 2013-11-11 DIAGNOSIS — Z0000 Encounter for general adult medical examination without abnormal findings: Secondary | ICD-10-CM

## 2013-11-11 NOTE — Progress Notes (Signed)
   Subjective:    Patient ID: Kimberly Wheeler, female    DOB: 1955-07-01, 58 y.o.   MRN: [id number]  HPI 58 yr old female for a cpx. She feels well.    Review of Systems  Constitutional: Negative.   HENT: Negative.   Eyes: Negative.   Respiratory: Negative.   Cardiovascular: Negative.   Gastrointestinal: Negative.   Genitourinary: Negative for dysuria, urgency, frequency, hematuria, flank pain, decreased urine volume, enuresis, difficulty urinating, pelvic pain and dyspareunia.  Musculoskeletal: Negative.   Skin: Negative.   Neurological: Negative.   Psychiatric/Behavioral: Negative.        Objective:   Physical Exam  Constitutional: She is oriented to person, place, and time. She appears well-developed and well-nourished. No distress.  HENT:  Head: Normocephalic and atraumatic.  Right Ear: External ear normal.  Left Ear: External ear normal.  Nose: Nose normal.  Mouth/Throat: Oropharynx is clear and moist. No oropharyngeal exudate.  Eyes: Conjunctivae and EOM are normal. Pupils are equal, round, and reactive to light. No scleral icterus.  Neck: Normal range of motion. Neck supple. No JVD present. No thyromegaly present.  Cardiovascular: Normal rate, regular rhythm, normal heart sounds and intact distal pulses.  Exam reveals no gallop and no friction rub.   No murmur heard. EKG normal   Pulmonary/Chest: Effort normal and breath sounds normal. No respiratory distress. She has no wheezes. She has no rales. She exhibits no tenderness.  Abdominal: Soft. Bowel sounds are normal. She exhibits no distension and no mass. There is no tenderness. There is no rebound and no guarding.  Musculoskeletal: Normal range of motion. She exhibits no edema and no tenderness.  Lymphadenopathy:    She has no cervical adenopathy.  Neurological: She is alert and oriented to person, place, and time. She has normal reflexes. No cranial nerve deficit. She exhibits normal muscle tone. Coordination normal.    Skin: Skin is warm and dry. No rash noted. No erythema.  Psychiatric: She has a normal mood and affect. Her behavior is normal. Judgment and thought content normal.          Assessment & Plan:  Well exam.

## 2013-11-11 NOTE — Progress Notes (Signed)
Pre visit review using our clinic review tool, if applicable. No additional management support is needed unless otherwise documented below in the visit note. 

## 2014-03-11 ENCOUNTER — Other Ambulatory Visit: Payer: Self-pay

## 2014-03-11 DIAGNOSIS — Z1239 Encounter for other screening for malignant neoplasm of breast: Secondary | ICD-10-CM

## 2014-03-20 ENCOUNTER — Ambulatory Visit
Admission: RE | Admit: 2014-03-20 | Discharge: 2014-03-20 | Disposition: A | Discharge status: Home / Self Care | Payer: BC Managed Care – PPO | Source: Ambulatory Visit

## 2014-03-20 DIAGNOSIS — Z1239 Encounter for other screening for malignant neoplasm of breast: Secondary | ICD-10-CM

## 2014-04-07 ENCOUNTER — Encounter: Payer: Self-pay | Admitting: Family Medicine

## 2014-05-07 ENCOUNTER — Telehealth: Payer: Self-pay | Admitting: Family Medicine

## 2014-05-07 NOTE — Telephone Encounter (Signed)
Pt fell about 2 wk ago and since then has not been feeling well. Can I use sda slot tomorrow

## 2014-05-07 NOTE — Telephone Encounter (Signed)
Okay to schedule

## 2014-05-07 NOTE — Telephone Encounter (Signed)
Pt has been sch

## 2014-05-08 ENCOUNTER — Ambulatory Visit (INDEPENDENT_AMBULATORY_CARE_PROVIDER_SITE_OTHER): Payer: BC Managed Care – PPO | Admitting: Family Medicine

## 2014-05-08 ENCOUNTER — Encounter: Payer: Self-pay | Admitting: Family Medicine

## 2014-05-08 VITALS — BP 117/62 | HR 77 | Temp 98.5°F | Ht 66.0 in | Wt 161.0 lb

## 2014-05-08 DIAGNOSIS — R5383 Other fatigue: Secondary | ICD-10-CM

## 2014-05-08 DIAGNOSIS — H8113 Benign paroxysmal vertigo, bilateral: Secondary | ICD-10-CM

## 2014-05-08 DIAGNOSIS — S20211S Contusion of right front wall of thorax, sequela: Secondary | ICD-10-CM

## 2014-05-08 LAB — BASIC METABOLIC PANEL
BUN: 14 mg/dL (ref 6–23)
CHLORIDE: 105 meq/L (ref 96–112)
CO2: 28 meq/L (ref 19–32)
Calcium: 9.5 mg/dL (ref 8.4–10.5)
Creatinine, Ser: 0.7 mg/dL (ref 0.4–1.2)
GFR: 91.61 mL/min (ref >60.00)
GLUCOSE: 80 mg/dL (ref 70–99)
POTASSIUM: 4.6 meq/L (ref 3.5–5.1)
Sodium: 142 mEq/L (ref 135–145)

## 2014-05-08 LAB — CBC WITH DIFFERENTIAL/PLATELET
BASOS ABS: 0 10*3/uL (ref 0.0–0.1)
Basophils Relative: 0.8 % (ref 0.0–3.0)
Eosinophils Absolute: 0.1 10*3/uL (ref 0.0–0.7)
Eosinophils Relative: 1.1 % (ref 0.0–5.0)
HEMATOCRIT: 41.4 % (ref 36.0–46.0)
Hemoglobin: 14 g/dL (ref 12.0–15.0)
LYMPHS ABS: 1.2 10*3/uL (ref 0.7–4.0)
LYMPHS PCT: 18.3 % (ref 12.0–46.0)
MCHC: 33.8 g/dL (ref 30.0–36.0)
MCV: 89.8 fl (ref 78.0–100.0)
MONOS PCT: 5.8 % (ref 3.0–12.0)
Monocytes Absolute: 0.4 10*3/uL (ref 0.1–1.0)
Neutro Abs: 4.8 10*3/uL (ref 1.4–7.7)
Neutrophils Relative %: 74 % (ref 43.0–77.0)
PLATELETS: 238 10*3/uL (ref 150.0–400.0)
RBC: 4.61 Mil/uL (ref 3.87–5.11)
RDW: 12.9 % (ref 11.5–15.5)
WBC: 6.5 10*3/uL (ref 4.0–10.5)

## 2014-05-08 LAB — HEPATIC FUNCTION PANEL
ALBUMIN: 4.4 g/dL (ref 3.5–5.2)
ALT: 16 U/L (ref 0–35)
AST: 17 U/L (ref 0–37)
Alkaline Phosphatase: 57 U/L (ref 39–117)
Bilirubin, Direct: 0.1 mg/dL (ref 0.0–0.3)
Total Bilirubin: 0.8 mg/dL (ref 0.2–1.2)
Total Protein: 7.1 g/dL (ref 6.0–8.3)

## 2014-05-08 LAB — VITAMIN D 25 HYDROXY (VIT D DEFICIENCY, FRACTURES): VITD: 20.99 ng/mL — ABNORMAL LOW (ref 30.00–100.00)

## 2014-05-08 LAB — VITAMIN B12: VITAMIN B 12: 190 pg/mL — AB (ref 211–911)

## 2014-05-08 LAB — FERRITIN: Ferritin: 52.5 ng/mL (ref 10.0–291.0)

## 2014-05-08 LAB — TSH: TSH: 1.84 u[IU]/mL (ref 0.35–4.50)

## 2014-05-08 NOTE — Progress Notes (Signed)
   Subjective:    Patient ID: Kimberly Wheeler, female    DOB: [birth year], 58 y.o.   MRN: [id number]  HPI Here with several issues. First she asks me to check her back. She fell down some steps at home on 04-30-14 when her foot slipped off the edge of a hardwood step. She did not hit her head but she struck her back on the edge of a landing. She had severe sharp pains and went to [facility] to be checked. Xrays revealed no rib fractures and she was told to take Ibuprofen. The pain has improved but she still has some pain in the right middle back. No SOB. Also she has been dizzy off and on for the past few weeks. No sinus pressure or HA or vision problems. Finally she has felt very fatigued for the past 2 months. She had a cpx here in June with normal labs.    Review of Systems  Constitutional: Positive for fatigue.  HENT: Negative.   Eyes: Negative.   Respiratory: Negative.   Cardiovascular: Negative.   Musculoskeletal: Positive for back pain.  Neurological: Positive for dizziness. Negative for tremors, seizures, syncope, facial asymmetry, speech difficulty, weakness, light-headedness, numbness and headaches.       Objective:   Physical Exam  Constitutional: She appears well-developed and well-nourished.  HENT:  Right Ear: External ear normal.  Left Ear: External ear normal.  Eyes: Conjunctivae are normal. Pupils are equal, round, and reactive to light.  Neck: Neck supple. No thyromegaly present.  Cardiovascular: Normal rate, regular rhythm, normal heart sounds and intact distal pulses.   Pulmonary/Chest: Effort normal and breath sounds normal.  She is tender over the right middle posterior ribs, no crepitus   Lymphadenopathy:    She has no cervical adenopathy.          Assessment & Plan:  She may have a costochondral separation. This takes 4-6 weeks to heal so she will give this some time. As for the vertigo. I suggested she see Dr. Ernesto Rutherford again, since her vertigo and  tinnitus may be related. As for the fatigue, we will check some labs today to rule out anemia, etc.

## 2014-05-08 NOTE — Progress Notes (Signed)
Pre visit review using our clinic review tool, if applicable. No additional management support is needed unless otherwise documented below in the visit note. 

## 2014-05-09 ENCOUNTER — Ambulatory Visit: Payer: BC Managed Care – PPO | Admitting: Gynecology

## 2014-05-16 MED ORDER — VITAMIN D (ERGOCALCIFEROL) 1.25 MG (50000 UNIT) PO CAPS
50000.0000 [IU] | ORAL_CAPSULE | ORAL | Status: DC
Start: 2014-05-16 — End: 2015-06-11

## 2014-05-16 NOTE — Addendum Note (Signed)
Addended by: Aggie Hacker A on: 05/16/2014 02:35 PM   Modules accepted: Orders

## 2014-05-19 ENCOUNTER — Ambulatory Visit (INDEPENDENT_AMBULATORY_CARE_PROVIDER_SITE_OTHER): Payer: BC Managed Care – PPO | Admitting: Family Medicine

## 2014-05-19 DIAGNOSIS — E538 Deficiency of other specified B group vitamins: Secondary | ICD-10-CM

## 2014-05-19 MED ORDER — CYANOCOBALAMIN 1000 MCG/ML IJ SOLN
1000.0000 ug | Freq: Once | INTRAMUSCULAR | Status: AC
  Administered 2014-05-19: 1000 ug via INTRAMUSCULAR

## 2014-05-26 ENCOUNTER — Ambulatory Visit: Payer: BC Managed Care – PPO | Admitting: Family Medicine

## 2014-05-27 ENCOUNTER — Ambulatory Visit (INDEPENDENT_AMBULATORY_CARE_PROVIDER_SITE_OTHER): Payer: BC Managed Care – PPO | Admitting: Family Medicine

## 2014-05-27 ENCOUNTER — Telehealth: Payer: Self-pay | Admitting: Family Medicine

## 2014-05-27 DIAGNOSIS — E538 Deficiency of other specified B group vitamins: Secondary | ICD-10-CM

## 2014-05-27 MED ORDER — CYANOCOBALAMIN 1000 MCG/ML IJ SOLN
1000.0000 ug | Freq: Once | INTRAMUSCULAR | Status: AC
  Administered 2014-05-27: 1000 ug via INTRAMUSCULAR

## 2014-05-27 NOTE — Telephone Encounter (Signed)
Pt is requesting a script for Vitamin B 12, she wants to have her husband give the injection. She is scheduled to have this once a week for 12 weeks. Per Dr. Sarajane Jews, okay to send in script to pharmacy with syringes. ( send to [facility] and call pt to let her know when this has been done )

## 2014-05-29 MED ORDER — NEEDLES & SYRINGES MISC
Status: DC
Start: 2014-05-29 — End: 2014-09-30

## 2014-05-29 MED ORDER — CYANOCOBALAMIN 1000 MCG/ML IJ SOLN
1000.0000 ug | INTRAMUSCULAR | Status: DC
Start: 2014-05-29 — End: 2015-06-11

## 2014-05-29 NOTE — Telephone Encounter (Signed)
Per Dr.Fry, okay to send in script for Vitamin B 12 medication ( 30 ml vial & syringes ) Directions inject 1 ml once a week for 12 weeks, pt has already had 2 injections here at office. I spoke with pharmacy and it is more cost effective to get a 30 ml vial rather than the single dose vials. I sent both scripts e-scribe and left a voice message for pt.

## 2014-06-04 ENCOUNTER — Ambulatory Visit (INDEPENDENT_AMBULATORY_CARE_PROVIDER_SITE_OTHER): Payer: BC Managed Care – PPO | Admitting: Certified Nurse Midwife

## 2014-06-04 ENCOUNTER — Encounter: Payer: Self-pay | Admitting: Certified Nurse Midwife

## 2014-06-04 VITALS — BP 110/68 | HR 70 | Resp 16 | Ht 65.75 in | Wt 162.0 lb

## 2014-06-04 DIAGNOSIS — N952 Postmenopausal atrophic vaginitis: Secondary | ICD-10-CM | POA: Diagnosis not present

## 2014-06-04 DIAGNOSIS — Z1211 Encounter for screening for malignant neoplasm of colon: Secondary | ICD-10-CM | POA: Diagnosis not present

## 2014-06-04 DIAGNOSIS — Z01419 Encounter for gynecological examination (general) (routine) without abnormal findings: Secondary | ICD-10-CM | POA: Diagnosis not present

## 2014-06-04 DIAGNOSIS — Z124 Encounter for screening for malignant neoplasm of cervix: Secondary | ICD-10-CM

## 2014-06-04 NOTE — Progress Notes (Signed)
58 y.o. G41P2002 Married Caucasian Fe here for annual exam. Menopausal no HRT . Denies vaginal bleeding. Vaginal dryness is an issue. Sees PCP for aex, medication management of Vit. D and B 12/ labs. No other health issues today.    Patient's last menstrual period was 03/07/2011.          Sexually active: Yes.    The current method of family planning is vasectomy.    Exercising: Yes.    walk & run Smoker:  no  Health Maintenance: Pap:  05-07-12 neg HPV HR neg MMG:  03-20-14 density category c, birads 1:neg Colonoscopy:  1/08 negative f/u 75yrs per chart per PCP 10 years BMD:  none TDaP:  2009 Labs: none Self breast exam: done occ   reports that she has never smoked. She has never used smokeless tobacco. She reports that she drinks about 1.8 - 2.4 oz of alcohol per week. She reports that she does not use illicit drugs.  Past Medical History  Diagnosis Date  . Lesion of bladder   . Arthritis     fingers  . Injury of fifth finger of right hand     Past Surgical History  Procedure Laterality Date  . Salpingoophorectomy  1991    LEFT  . Cystoscopy with biopsy  06/15/2011    Procedure: CYSTOSCOPY WITH BIOPSY;  Surgeon: Molli Hazard, MD;  Location: [facility];  Service: Urology;  Laterality: N/A;  BLADDER BIOPSY C-ARM CAMERA    . Cystoscopy w/ retrogrades  06/15/2011    Procedure: CYSTOSCOPY WITH RETROGRADE PYELOGRAM;  Surgeon: Molli Hazard, MD;  Location: [facility];  Service: Urology;  Laterality: Bilateral;  (BIL) RPG  . Cesarean section  1986  . Cesarean section w/btl  1988    Current Outpatient Prescriptions  Medication Sig Dispense Refill  . cyanocobalamin (,VITAMIN B-12,) 1000 MCG/ML injection Inject 1 mL (1,000 mcg total) into the muscle once a week. 30 mL 0  . Needles & Syringes MISC Dispense for IM injection. 12 each 0  . TRIMETHOPRIM PO Take by mouth as needed.    . Vitamin D, Ergocalciferol, (DRISDOL) 50000 UNITS CAPS  capsule Take 1 capsule (50,000 Units total) by mouth every 7 (seven) days. 4 capsule 2   No current facility-administered medications for this visit.    Family History  Problem Relation Age of Onset  . Osteoporosis Mother   . Prostate cancer Father   . Diabetes Maternal Grandmother   . Heart attack Maternal Grandmother     ROS:  Pertinent items are noted in HPI.  Otherwise, a comprehensive ROS was negative.  Exam:   BP 110/68 mmHg  Pulse 70  Resp 16  Ht 5' 5.75" (1.67 m)  Wt 162 lb (73.483 kg)  BMI 26.35 kg/m2  LMP 03/07/2011 Height: 5' 5.75" (167 cm)  Ht Readings from Last 3 Encounters:  06/04/14 5' 5.75" (1.67 m)  05/08/14 5\' 6"  (1.676 m)  11/11/13 5\' 6"  (1.676 m)    General appearance: alert, cooperative and appears stated age Head: Normocephalic, without obvious abnormality, atraumatic Neck: no adenopathy, supple, symmetrical, trachea midline and thyroid normal to inspection and palpation Lungs: clear to auscultation bilaterally Breasts: normal appearance, no masses or tenderness, No nipple retraction or dimpling, No nipple discharge or bleeding, No axillary or supraclavicular adenopathy Heart: regular rate and rhythm Abdomen: soft, non-tender; no masses,  no organomegaly Extremities: extremities normal, atraumatic, no cyanosis or edema Skin: Skin color, texture, turgor normal. No rashes or  lesions Lymph nodes: Cervical, supraclavicular, and axillary nodes normal. No abnormal inguinal nodes palpated Neurologic: Grossly normal   Pelvic: External genitalia:  no lesions, normal female              Urethra:  normal appearing urethra with no masses, tenderness or lesions              Bartholin's and Skene's: normal                 Vagina: atrophic appearing vagina with pale color and discharge, no lesions, scant moisture, slight increase pink at introitus, shown area to patient with mirror              Cervix: normal, non tender, no lesions              Pap taken: Yes.    Bimanual Exam:  Uterus:  normal size, contour, position, consistency, mobility, non-tender and anteverted              Adnexa: normal adnexa and no mass, fullness, tenderness               Rectovaginal: Confirms               Anus:  normal sphincter tone, no lesions  A:  Well Woman with normal exam  Menopausal no HRT  Atrophic Vaginitis  History of post coital UTI with Urology management with Trimethoprim  P:   Reviewed health and wellness pertinent to exam  Discussed importance of advising if vaginal bleeding.  Discussed findings and option for treatment with Estrogen or OTC. Patient has Premarin cream from urology, but has only used a few times. Has discomfort with sexual activity and has not found it has helped. Discussed consistent use to hydrate tissue on regular basis is recommended for good outcome. Patient would prefer other source. Discussed OTC pure coconut oil used nightly and for sexual activity. Suggested she could use Premarin twice weekly to start and then supplement with Coconut if she feels more comfortable with use. Patient will decide and advise if issues.  Continue follow up as indicated.  Per chart colonoscopy due in 5 years, but patient's PCP recommends 10 years with negative results. Patient has family history of polyps only. Dispensed IFOB today.  Pap smear taken today with HPV reflex   counseled on breast self exam, mammography screening, menopause, adequate intake of calcium and vitamin D, diet and exercise  return annually or prn  An After Visit Summary was printed and given to the patient.

## 2014-06-04 NOTE — Patient Instructions (Signed)
Atrophic Vaginitis Atrophic vaginitis is a problem of low levels of estrogen in women. This problem can happen at any age. It is most common in women who have gone through menopause ("the change").  HOW WILL I KNOW IF I HAVE THIS PROBLEM? You may have:  Trouble with peeing (urinating), such as:  Going to the bathroom often.  A hard time holding your pee until you reach a bathroom.  Leaking pee.  Having pain when you pee.  Itching or a burning feeling.  Vaginal bleeding and spotting.  Pain during sex.  Dryness of the vagina.  A yellow, bad-smelling fluid (discharge) coming from the vagina. HOW WILL MY DOCTOR CHECK FOR THIS PROBLEM?  During your exam, your doctor will likely find the problem.  If there is a vaginal fluid, it may be checked for infection. HOW WILL THIS PROBLEM BE TREATED? Keep the vulvar skin as clean as possible. Moisturizers and lubricants can help with some of the symptoms. Estrogen replacement can help. There are 2 ways to take estrogen:  Systemic estrogen gets estrogen to your whole body. It takes many weeks or months before the symptoms get better.  You take an estrogen pill.  You use a skin patch. This is a patch that you put on your skin.  If you still have your uterus, your doctor may ask you to take a hormone. Talk to your doctor about the right medicine for you.  Estrogen cream.  This puts estrogen only at the part of your body where you apply it. The cream is put into the vagina or put on the vulvar skin. For some women, estrogen cream works faster than pills or the patch. CAN ALL WOMEN WITH THIS PROBLEM USE ESTROGEN? No. Women with certain types of cancer, liver problems, or problems with blood clots should not take estrogen. Your doctor can help you decide the best treatment for your symptoms. Document Released: 11/09/2007 Document Revised: 05/28/2013 Document Reviewed: 11/09/2007 [facility] Patient Information 2015 [place], [place]. This  information is not intended to replace advice given to you by your health care provider. Make sure you discuss any questions you have with your health care provider.  EXERCISE AND DIET:  We recommended that you start or continue a regular exercise program for good health. Regular exercise means any activity that makes your heart beat faster and makes you sweat.  We recommend exercising at least 30 minutes per day at least 3 days a week, preferably 4 or 5.  We also recommend a diet low in fat and sugar.  Inactivity, poor dietary choices and obesity can cause diabetes, heart attack, stroke, and kidney damage, among others.    ALCOHOL AND SMOKING:  Women should limit their alcohol intake to no more than 7 drinks/beers/glasses of wine (combined, not each!) per week. Moderation of alcohol intake to this level decreases your risk of breast cancer and liver damage. And of course, no recreational drugs are part of a healthy lifestyle.  And absolutely no smoking or even second hand smoke. Most people know smoking can cause heart and lung diseases, but did you know it also contributes to weakening of your bones? Aging of your skin?  Yellowing of your teeth and nails?  CALCIUM AND VITAMIN D:  Adequate intake of calcium and Vitamin D are recommended.  The recommendations for exact amounts of these supplements seem to change often, but generally speaking 600 mg of calcium (either carbonate or citrate) and 800 units of Vitamin D per day seems  prudent. Certain women may benefit from higher intake of Vitamin D.  If you are among these women, your doctor will have told you during your visit.    PAP SMEARS:  Pap smears, to check for cervical cancer or precancers,  have traditionally been done yearly, although recent scientific advances have shown that most women can have pap smears less often.  However, every woman still should have a physical exam from her gynecologist every year. It will include a breast check, inspection  of the vulva and vagina to check for abnormal growths or skin changes, a visual exam of the cervix, and then an exam to evaluate the size and shape of the uterus and ovaries.  And after 57 years of age, a rectal exam is indicated to check for rectal cancers. We will also provide age appropriate advice regarding health maintenance, like when you should have certain vaccines, screening for sexually transmitted diseases, bone density testing, colonoscopy, mammograms, etc.   MAMMOGRAMS:  All women over 57 years old should have a yearly mammogram. Many facilities now offer a "3D" mammogram, which may cost around $50 extra out of pocket. If possible,  we recommend you accept the option to have the 3D mammogram performed.  It both reduces the number of women who will be called back for extra views which then turn out to be normal, and it is better than the routine mammogram at detecting truly abnormal areas.    COLONOSCOPY:  Colonoscopy to screen for colon cancer is recommended for all women at age 7.  We know, you hate the idea of the prep.  We agree, BUT, having colon cancer and not knowing it is worse!!  Colon cancer so often starts as a polyp that can be seen and removed at colonscopy, which can quite literally save your life!  And if your first colonoscopy is normal and you have no family history of colon cancer, most women don't have to have it again for 10 years.  Once every ten years, you can do something that may end up saving your life, right?  We will be happy to help you get it scheduled when you are ready.  Be sure to check your insurance coverage so you understand how much it will cost.  It may be covered as a preventative service at no cost, but you should check your particular policy.      Great to meet you today. Have a good year! Debbi

## 2014-06-04 NOTE — Progress Notes (Signed)
Reviewed personally.  M. Suzanne Jeannette Maddy, MD.  

## 2014-06-05 LAB — IPS PAP TEST WITH REFLEX TO HPV

## 2014-06-05 NOTE — Addendum Note (Signed)
Addended by: Regina Eck on: 06/05/2014 08:53 AM   Modules accepted: Orders, SmartSet

## 2014-07-09 NOTE — Progress Notes (Signed)
ifob letter sent to patient to remind her to turn it in to [place].

## 2014-09-25 ENCOUNTER — Telehealth: Payer: Self-pay | Admitting: Obstetrics & Gynecology

## 2014-09-25 ENCOUNTER — Telehealth: Payer: Self-pay | Admitting: Certified Nurse Midwife

## 2014-09-25 DIAGNOSIS — N950 Postmenopausal bleeding: Secondary | ICD-10-CM

## 2014-09-25 NOTE — Telephone Encounter (Signed)
Spoke with patient. Patient states "Last week I felt dizzy and out of it and when I went to the restroom I was having some bleeding. It lasted for two days and then stopped. Now today I am having some cramping that feels like when I would have my period. Since I have not had a cycle in 4 years I thought I should call to get this checked out." Denies any current bleeding, dizziness, weakness, shortness of breath, or urinary symptoms. Advised patient will need to be seen in office for further evaluation. Offered appointment for tomorrow but patient declines due to going out of town for the weekend. Appointment scheduled for 09/30/2014 at 2:30pm with Dr.Miller. Patient is agreeable to date and time. Discussed possible EMB at appointment. Patient is agreeable. Advised to take 800mg  of ibuprofen/motrin one hour before appointment in case this is needed. Patient is agreeable. Order placed for precert in case this is needed at appointment.  Routing to provider for final review. Patient agreeable to disposition. Will close encounter

## 2014-09-25 NOTE — Telephone Encounter (Signed)
Pt states she has been menopause 4 years and last week experienced spotting for 2 days with some cramping/discomfort.

## 2014-09-25 NOTE — Telephone Encounter (Signed)
Call to patient to obtain current coverage information as the [employer] details that we have on file termed 12.31.2015. Patient states that her card is not handy right now, but that she will call back with the new information today.

## 2014-09-26 NOTE — Telephone Encounter (Signed)
Left message for patient to call back. Need to go over benefits for EMB

## 2014-09-29 NOTE — Telephone Encounter (Signed)
Agree with plan.  Encounter closed. 

## 2014-09-29 NOTE — Telephone Encounter (Signed)
Patient has an appointment for EMB tomorrow. Patient is having pain, nausea and dizziness. Patient is not sure if should wait until tomorrow to be seen .Please call ASAP.

## 2014-09-29 NOTE — Telephone Encounter (Signed)
Spoke with patient. Advised patient that I have spoken with Dr.Miller who recommends that patient be seen with PCP today for evaluation and keep appointment with our office for tomorrow. Advised we want to ensure she is having complete evaluation for all symptoms. Patient is agreeable. Will call to schedule appointment with PCP and keep appointment for tomorrow with Dr.Miller for evaluation and possible EMB.  Routing to [place] for review.

## 2014-09-29 NOTE — Telephone Encounter (Signed)
Spoke with patient. Patient states she has been experiencing a lot of vaginal and abdominal pain since Saturday. Is also experiencing nausea and dizziness. Patient is postmenopausal and had light spotting last week. Has not had any further bleeding since. Please see telephone note from 09/25/2014. Has had increased urinary frequency. Denies lower back pain, burning with urination, fever, and chills. Denies shortness of breath. Pain is currently a 7/10. Took tylenol with slight relief at 9am this morning. Patient is scheduled for evaluation and possible EMB tomorrow with Dr.Miller.

## 2014-09-30 ENCOUNTER — Ambulatory Visit (INDEPENDENT_AMBULATORY_CARE_PROVIDER_SITE_OTHER): Payer: BLUE CROSS/BLUE SHIELD | Admitting: Obstetrics & Gynecology

## 2014-09-30 VITALS — BP 132/60 | HR 68 | Resp 16 | Wt 162.2 lb

## 2014-09-30 DIAGNOSIS — R319 Hematuria, unspecified: Secondary | ICD-10-CM

## 2014-09-30 DIAGNOSIS — N390 Urinary tract infection, site not specified: Secondary | ICD-10-CM | POA: Diagnosis not present

## 2014-09-30 DIAGNOSIS — N950 Postmenopausal bleeding: Secondary | ICD-10-CM | POA: Diagnosis not present

## 2014-09-30 LAB — POCT URINALYSIS DIPSTICK
Bilirubin, UA: NEGATIVE
Glucose, UA: NEGATIVE
Ketones, UA: NEGATIVE
NITRITE UA: NEGATIVE
PH UA: 5
PROTEIN UA: NEGATIVE
Urobilinogen, UA: NEGATIVE

## 2014-09-30 MED ORDER — CIPROFLOXACIN HCL 500 MG PO TABS: 500.0000 mg | ORAL_TABLET | Freq: Two times a day (BID) | ORAL | Status: DC

## 2014-09-30 NOTE — Progress Notes (Signed)
Subjective:     Patient ID: Kimberly Wheeler, female   DOB: 06/08/1955, 59 y.o.   MRN: [id number]  HPI 59 yo G2P2 MWF here for endometrial biopsy due to PMP bleeding.  Before proceeding with biopsy, pt wants to see if ok to wait as she is feeling so badly.  H/o recurrent UTI's that are often culture negative.  Has had extensive evaluation at [facility] including cystoscopy with mucosal biopsies (that were all negative).  Had been seeing Dr. Jasmine December who has now moved to [place].  Pt called for appt but doesn't have one until Friday.  Symptoms for her always include lower groin pain, dysuria, right flank pain, and sometimes low grade fevers.    Would very much like to be treated for this today.  Also, had biopsy in 2012 and HATED it.  Would very much like to avoid if possible.  Review of Systems  All other systems reviewed and are negative.      Objective:   Physical Exam  Constitutional: She appears well-developed and well-nourished.  Abdominal: Soft. Bowel sounds are normal. She exhibits no distension and no mass. There is tenderness (mild in llq). There is no rebound and no guarding.  Genitourinary: Vagina normal. There is no rash, tenderness or lesion on the right labia. There is no rash, tenderness or lesion on the left labia. Uterus is enlarged (aobut 8 weeks). Cervix exhibits no motion tenderness, no discharge and no friability. Right adnexum displays no mass, no tenderness and no fullness. Left adnexum displays no mass, no tenderness and no fullness. No bleeding in the vagina. No foreign body around the vagina.  Lymphadenopathy:       Right: No inguinal adenopathy present.       Assessment:     Recurrent UTIs, now establishing care with new provider PMP bleeding, declines biopsy today but willing to proceed with evaluation, just not today    Plan:     Ciprofloxin 500mg  bid x 10 days No urine culture obtained as pt has been on trimethoprim for almost 48 hours Return for PUS.   If endometrium is thin, will not proceed with biopsy.  If thickened, will do paracervical block first before proceeding.  All questionsns answered.

## 2014-10-01 ENCOUNTER — Encounter: Payer: Self-pay | Admitting: Obstetrics & Gynecology

## 2014-10-01 NOTE — Telephone Encounter (Signed)
Left message for patient to call back. Need to go over benefits and schedule PUS °

## 2014-10-03 NOTE — Telephone Encounter (Signed)
Patient returned call. I advised of benefit quote received for PUS.  Patient agreeable. Scheduled PUS

## 2014-10-07 ENCOUNTER — Telehealth: Payer: Self-pay | Admitting: Obstetrics & Gynecology

## 2014-10-07 ENCOUNTER — Other Ambulatory Visit: Payer: BLUE CROSS/BLUE SHIELD | Admitting: Obstetrics & Gynecology

## 2014-10-07 ENCOUNTER — Other Ambulatory Visit: Payer: BLUE CROSS/BLUE SHIELD

## 2014-10-07 LAB — FECAL OCCULT BLOOD, IMMUNOCHEMICAL: IFOBT: NEGATIVE

## 2014-10-07 NOTE — Telephone Encounter (Signed)
Pt called at [phone] to cancel 230 ultrasound appointment today. Pt dnka. Please see staff message.

## 2014-10-13 NOTE — Addendum Note (Signed)
Addended by: Alfonzo Feller on: 10/13/2014 11:47 AM   Modules accepted: Orders, SmartSet

## 2014-10-13 NOTE — Telephone Encounter (Signed)
Dr Sabra Heck is aware-triage nurse to call patient.//kn

## 2014-10-13 NOTE — Telephone Encounter (Signed)
Message left to return call to [facility] at [phone] to discuss importance of scheduling at least pelvic ultrasound to evaluated endometrial lining and (potential only) endometrial biopsy to evaluate for any presence of abnormal cells or precancerous changes of the uterine lining, infection, polyps or fibroids.

## 2014-10-20 NOTE — Telephone Encounter (Signed)
Message left to return call to Aroush Chasse at [phone].    

## 2014-10-29 NOTE — Telephone Encounter (Signed)
Call to patient. She states that she has been unable to schedule due to husband losing his job and finances are tight right now.  She states she was not aware of Pelvic ultrasound cancellation policy.  Patient agrees to reschedule ultrasound and Patient verbalized understanding of the U/S appointment cancellation policy. Advised will need to cancel or reschedule within 72 business hours of appointment (3 business days) or will have $100.00 late cancellation fee placed to account.   Scheduled for 11/06/14 at 1230 with Dr. Sabra Heck.  Routing to provider for final review. Patient agreeable to disposition. Will close encounter.

## 2014-11-06 ENCOUNTER — Encounter: Payer: Self-pay | Admitting: Obstetrics & Gynecology

## 2014-11-06 ENCOUNTER — Ambulatory Visit (INDEPENDENT_AMBULATORY_CARE_PROVIDER_SITE_OTHER): Payer: BLUE CROSS/BLUE SHIELD | Admitting: Obstetrics & Gynecology

## 2014-11-06 ENCOUNTER — Ambulatory Visit (INDEPENDENT_AMBULATORY_CARE_PROVIDER_SITE_OTHER): Payer: BLUE CROSS/BLUE SHIELD

## 2014-11-06 VITALS — BP 118/78 | Wt 164.0 lb

## 2014-11-06 DIAGNOSIS — N950 Postmenopausal bleeding: Secondary | ICD-10-CM | POA: Diagnosis not present

## 2014-11-06 DIAGNOSIS — D252 Subserosal leiomyoma of uterus: Secondary | ICD-10-CM | POA: Diagnosis not present

## 2014-11-06 DIAGNOSIS — D250 Submucous leiomyoma of uterus: Secondary | ICD-10-CM | POA: Insufficient documentation

## 2014-11-06 DIAGNOSIS — D251 Intramural leiomyoma of uterus: Secondary | ICD-10-CM | POA: Diagnosis not present

## 2014-11-06 NOTE — Progress Notes (Signed)
59 y.o. Kimberly Wheeler here for a pelvic ultrasound due to single episode of PMP bleeding that was light and spotty about a month ago as well as known hx of fibroids.  Last PUS was done in our office in 2011.  I do have pictures for comparison.  Pt denies any additional bleeding.  Absolutely does not want another endometrial biopsy unless is completely necessary.    Patient's last menstrual period was 03/07/2011.  Sexually active:  yes  Contraception: vasectomy  FINDINGS:  UTERUS: 7.4 x 5.8 x 5.0cm uterus with multiple intramural fibroids, a subserosal fibroid, and a submucosal fibroid.  Fibroids range from 1.0 to 2.0cm.  Larges is the submucosal fibroid.  Six fibroids seen today.  Ten were seen in 2011 and all appear smaller.  Submucosal fibroid was 3.6 x 2.4cm and is now 2.0 x 2.0cm EMS: 2.51mm ADNEXA:   Left ovary surgically absent   Right ovary 2.6 x 1.8 x 1.4cm CUL DE SAC: no free fluid  As endometrium appears thin and uterus as well as fibroids are all smaller, feel it is ok to watch.  Pt aware if she has additional bleeding, I will want to do a biopsy so she is advised to call if this happens.  Hysteroscopic fibroid resection as well as hysterectomy discussed.  Pt has questions about whether laparoscopy could be done.  I think it is a good possibility as she has only has 2 cesarean sections and one procedure for LSO due to ruptured cyst with bleeding.  With minimal issues she is currently having, i would advise her to wait and watch right now.  She is very comfortable with this plan and voices understanding of importance of calling with additional bleeding.    Assessment: Uterine fibroids, submucosal, intramural and subserosal.  Overall decrease in uterine size since PUS 2011 PMP bleeding with endometrium of 2.45mm today  Plan: Pt is to call with repeat bleeding and will return for endometrial biopsy then.  For now, will continue to monitor conservatively.  ~20 minutes spent with patient  >50% of time was in face to face discussion of above.

## 2015-04-07 ENCOUNTER — Other Ambulatory Visit: Payer: Self-pay

## 2015-04-07 DIAGNOSIS — Z1231 Encounter for screening mammogram for malignant neoplasm of breast: Secondary | ICD-10-CM

## 2015-05-07 ENCOUNTER — Ambulatory Visit: Payer: Self-pay

## 2015-06-11 ENCOUNTER — Encounter: Payer: Self-pay | Admitting: Certified Nurse Midwife

## 2015-06-11 ENCOUNTER — Ambulatory Visit (INDEPENDENT_AMBULATORY_CARE_PROVIDER_SITE_OTHER): Payer: BLUE CROSS/BLUE SHIELD | Admitting: Certified Nurse Midwife

## 2015-06-11 VITALS — BP 116/68 | HR 72 | Resp 16 | Ht 66.0 in | Wt 150.0 lb

## 2015-06-11 DIAGNOSIS — Z0000 Encounter for general adult medical examination without abnormal findings: Secondary | ICD-10-CM

## 2015-06-11 DIAGNOSIS — Z01419 Encounter for gynecological examination (general) (routine) without abnormal findings: Secondary | ICD-10-CM

## 2015-06-11 LAB — POCT URINALYSIS DIPSTICK
BILIRUBIN UA: NEGATIVE
Blood, UA: NEGATIVE
GLUCOSE UA: NEGATIVE
KETONES UA: NEGATIVE
LEUKOCYTES UA: NEGATIVE
Nitrite, UA: NEGATIVE
PH UA: 5
Protein, UA: NEGATIVE
Urobilinogen, UA: NEGATIVE

## 2015-06-11 LAB — TSH: TSH: 2.464 u[IU]/mL (ref 0.350–4.500)

## 2015-06-11 NOTE — Patient Instructions (Signed)

## 2015-06-11 NOTE — Progress Notes (Signed)
60 y.o. G2P2002 Married  Caucasian Fe here for annual exam. Menopausal no vaginal bleeding since evaluation for in 11/06/14 with fibroid. Occasional vaginal dryness use coconut oil wafers she makes and inserts, works well. Sees PCP prn. Recovering from viral URI, feeling much better with OTC medication and rest. No fever or chills. Desires screening labs as recommended. No other health concerns.  Patient's last menstrual period was 03/07/2011.  Spotting 2016        Sexually active: Yes.    The current method of family planning is tubal ligation.    Exercising: Yes.    running Smoker:  no  Health Maintenance: Pap:  06-04-14 neg with HPV reflex MMG:  03-20-14 category c density, birads 1:neg, scheduled for Monday recommended 3 D yearly due to density Colonoscopy:  1/08 neg f/u 40yrs per chart, per PCP 30yrs BMD:   none TDaP:  2016 Shingles: no Pneumonia: no Hep C and HIV: not done requests Labs: yes Self breast exam: done occ   reports that she has never smoked. She has never used smokeless tobacco. She reports that she drinks about 3.0 oz of alcohol per week. She reports that she does not use illicit drugs.  Past Medical History  Diagnosis Date  . Lesion of bladder   . Arthritis     fingers  . Injury of fifth finger of right hand     Past Surgical History  Procedure Laterality Date  . Salpingoophorectomy  1991    LEFT  . Cystoscopy with biopsy  06/15/2011    Procedure: CYSTOSCOPY WITH BIOPSY;  Surgeon: Molli Hazard, MD;  Location: [facility];  Service: Urology;  Laterality: N/A;  BLADDER BIOPSY C-ARM CAMERA    . Cystoscopy w/ retrogrades  06/15/2011    Procedure: CYSTOSCOPY WITH RETROGRADE PYELOGRAM;  Surgeon: Molli Hazard, MD;  Location: [facility];  Service: Urology;  Laterality: Bilateral;  (BIL) RPG  . Cesarean section  1986  . Cesarean section w/btl  1988    Current Outpatient Prescriptions  Medication Sig Dispense Refill   . TRIMETHOPRIM PO Take by mouth as needed.     No current facility-administered medications for this visit.    Family History  Problem Relation Age of Onset  . Osteoporosis Mother   . Prostate cancer Father   . Diabetes Maternal Grandmother   . Heart attack Maternal Grandmother     ROS:  Pertinent items are noted in HPI.  Otherwise, a comprehensive ROS was negative.  Exam:   BP 116/68 mmHg  Pulse 72  Resp 16  Ht 5\' 6"  (1.676 m)  Wt 150 lb (68.04 kg)  BMI 24.22 kg/m2  LMP 03/07/2011 Height: 5\' 6"  (167.6 cm) Ht Readings from Last 3 Encounters:  06/11/15 5\' 6"  (1.676 m)  06/04/14 5' 5.75" (1.67 m)  05/08/14 5\' 6"  (1.676 m)    General appearance: alert, cooperative and appears stated age Head: Normocephalic, without obvious abnormality, atraumatic Neck: no adenopathy, supple, symmetrical, trachea midline and thyroid normal to inspection and palpation Lungs: clear to auscultation bilaterally,no rales or rubbing noted Breasts: normal appearance, no masses or tenderness, No nipple retraction or dimpling, No nipple discharge or bleeding, No axillary or supraclavicular adenopathy Heart: regular rate and rhythm Abdomen: soft, non-tender; no masses,  no organomegaly Extremities: extremities normal, atraumatic, no cyanosis or edema Skin: Skin color, texture, turgor normal. No rashes or lesions Lymph nodes: Cervical, supraclavicular, and axillary nodes normal. No abnormal inguinal nodes palpated Neurologic: Grossly normal  Pelvic: External genitalia:  no lesions              Urethra:  normal appearing urethra with no masses, tenderness or lesions              Bartholin's and Skene's: normal                 Vagina: normal appearing vagina with normal color and discharge, no lesions              Cervix: normal,non tender, no lesions              Pap taken: No. Bimanual Exam:  Uterus:  normal size, contour, position, consistency, mobility, non-tender              Adnexa: normal  adnexa and no mass, fullness, tenderness               Rectovaginal: Confirms               Anus:  normal sphincter tone, no lesions  Chaperone present: yes  A:  Well Woman with normal exam  Menopausal no HRT  PMB due to fibroid, no other bleeding since occurrence  URI resolving  Screening labs  Post coital UTI history with Urology management  P:   Reviewed health and wellness pertinent to exam  Aware of need to evaluate if vaginal bleeding occurs again.  If URI does not resolve needs to see PCP patient aware  Lab: HIV,Hep. C, Lipid panel , TSH, Vitamin D  Pap smear as above not taken  Continue follow up as indicated   counseled on breast self exam, mammography screening, adequate intake of calcium and vitamin D, diet and exercise  return annually or prn  An After Visit Summary was printed and given to the patient.

## 2015-06-12 LAB — LIPID PANEL
Cholesterol: 226 mg/dL — ABNORMAL HIGH (ref 125–200)
HDL: 87 mg/dL (ref >=46)
LDL Cholesterol: 119 mg/dL (ref <130)
TRIGLYCERIDES: 99 mg/dL (ref <150)
Total CHOL/HDL Ratio: 2.6 Ratio (ref <=5.0)
VLDL: 20 mg/dL (ref <30)

## 2015-06-12 LAB — VITAMIN D 25 HYDROXY (VIT D DEFICIENCY, FRACTURES): Vit D, 25-Hydroxy: 29 ng/mL — ABNORMAL LOW (ref 30–100)

## 2015-06-12 LAB — HIV ANTIBODY (ROUTINE TESTING W REFLEX): HIV 1&2 Ab, 4th Generation: NONREACTIVE

## 2015-06-12 LAB — HEPATITIS C ANTIBODY: HCV Ab: NEGATIVE

## 2015-06-15 ENCOUNTER — Inpatient Hospital Stay: Admission: RE | Admit: 2015-06-15 | Payer: Self-pay | Source: Ambulatory Visit

## 2015-06-15 NOTE — Progress Notes (Signed)
Reviewed personally.  M. Suzanne Mazin Emma, MD.  

## 2015-08-13 ENCOUNTER — Ambulatory Visit
Admission: RE | Admit: 2015-08-13 | Discharge: 2015-08-13 | Disposition: A | Discharge status: Home / Self Care | Payer: BLUE CROSS/BLUE SHIELD | Source: Ambulatory Visit

## 2015-08-13 DIAGNOSIS — Z1231 Encounter for screening mammogram for malignant neoplasm of breast: Secondary | ICD-10-CM

## 2015-11-09 ENCOUNTER — Telehealth: Payer: Self-pay | Admitting: Certified Nurse Midwife

## 2015-11-09 NOTE — Telephone Encounter (Signed)
Spoke with patient. Patient reports 2 weeks ago she had a sharp pain in what felt like her "cervix." On Friday 11/06/2015 she had intercourse and had discomfort and bleeding. Reports bleeding stopped shortly after intercourse, but discomfort has persisted. "I cannot really explain it. I am just achy and it feels like it is my cervix." Advised she will need to be seen in the office for further evaluation. She is agreeable. Appointment scheduled for tomorrow 11/10/2015 at 3 pm with Dr.Miller. She is agreeable to date and time.  Routing to provider for final review. Patient agreeable to disposition. Will close encounter.

## 2015-11-09 NOTE — Telephone Encounter (Signed)
Patient called and said, "I am post menopausal and am having some cervical pain. I had sex this weekend and also had some bleeding after that." Patient requesting an appointment.  Last AEX 06/11/15

## 2015-11-10 ENCOUNTER — Ambulatory Visit (INDEPENDENT_AMBULATORY_CARE_PROVIDER_SITE_OTHER): Payer: BLUE CROSS/BLUE SHIELD | Admitting: Obstetrics & Gynecology

## 2015-11-10 ENCOUNTER — Encounter: Payer: Self-pay | Admitting: Obstetrics & Gynecology

## 2015-11-10 VITALS — BP 108/72 | HR 86 | Temp 98.5°F | Resp 16 | Ht 66.0 in | Wt 140.0 lb

## 2015-11-10 DIAGNOSIS — N950 Postmenopausal bleeding: Secondary | ICD-10-CM

## 2015-11-10 DIAGNOSIS — R300 Dysuria: Secondary | ICD-10-CM

## 2015-11-10 LAB — POCT URINALYSIS DIPSTICK
Bilirubin, UA: NEGATIVE
Blood, UA: NEGATIVE
Glucose, UA: NEGATIVE
Ketones, UA: NEGATIVE
NITRITE UA: NEGATIVE
PH UA: 6
PROTEIN UA: NEGATIVE
UROBILINOGEN UA: NEGATIVE

## 2015-11-10 MED ORDER — ESTROGENS, CONJUGATED 0.625 MG/GM VA CREA: TOPICAL_CREAM | VAGINAL | Status: DC

## 2015-11-10 NOTE — Progress Notes (Signed)
GYNECOLOGY  VISIT   HPI: 60 y.o. G43P2002 Married Caucasian female with complaint of PMP bleeding/post coital bleeding that occurred on 11/06/15.  She did not have associated pain.  After intercourse, there was a large spot of, about the size of a small orange, in her under ware that was bright red.  She's had a little dark spotting/discharge since then.  She also report she might be getting a UTI with a little bit of increased frequency and dysuria.  She is having some fullness in the vagina as well.    Reports some vaginal burning and coconut oil does help. This has been going for a few years.  Although, she reports the burning has worsened as of late and she is feeling increased dryness/discomfort with intercourse..    She does take trimethoprim for UTI prophylaxis.  Last UTI that pt can remember was two or three months ago.  Saw Dr. Dorina Hoyer at [facility] for this.    GYNECOLOGIC HISTORY: Patient's last menstrual period was 03/07/2011. Contraception: BTL  Patient Active Problem List   Diagnosis Date Noted  . Intramural leiomyoma of uterus 11/06/2014  . Submucous leiomyoma of uterus 11/06/2014  . Subserous leiomyoma of uterus 11/06/2014  . ACUTE BRONCHITIS 06/25/2009  . ACUTE SINUSITIS, UNSPECIFIED 06/26/2007  . 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 UTI'S, HX OF 06/26/2007  . CHICKENPOX, HX OF 06/26/2007    Past Medical History  Diagnosis Date  . Lesion of bladder   . Arthritis     fingers  . Injury of fifth finger of right hand   . Fibroid   . Chronic UTI (urinary tract infection)     Past Surgical History  Procedure Laterality Date  . Salpingoophorectomy  1991    LEFT  . Cystoscopy with biopsy  06/15/2011    Procedure: CYSTOSCOPY WITH BIOPSY;  Surgeon: Molli Hazard, MD;  Location: [facility];  Service: Urology;  Laterality: N/A;  BLADDER BIOPSY C-ARM CAMERA    . Cystoscopy w/ retrogrades  06/15/2011    Procedure: CYSTOSCOPY WITH  RETROGRADE PYELOGRAM;  Surgeon: Molli Hazard, MD;  Location: [facility];  Service: Urology;  Laterality: Bilateral;  (BIL) RPG  . Cesarean section  1986  . Cesarean section w/btl  1988    MEDS:  Reviewed in EPIC and UTD  ALLERGIES: Darvon and Macrodantin  Family History  Problem Relation Age of Onset  . Osteoporosis Mother   . Prostate cancer Father   . Diabetes Maternal Grandmother   . Heart attack Maternal Grandmother     SH:  Married, non smoker  Review of Systems  All other systems reviewed and are negative.   PHYSICAL EXAMINATION:    BP 108/72 mmHg  Pulse 86  Temp(Src) 98.5 F (36.9 C) (Oral)  Resp 16  Ht 5\' 6"  (1.676 m)  Wt 140 lb (63.504 kg)  BMI 22.61 kg/m2  LMP 03/07/2011    General appearance: alert, cooperative and appears stated age Abdomen: soft, non-tender; bowel sounds normal; no masses,  no organomegaly  Pelvic: External genitalia:  no lesions              Urethra:  normal appearing urethra with no masses, tenderness or lesions              Bartholins and Skenes: normal                 Vagina: dark, old blood appearing discharge that is not coming from the  cervical os and is mostly present on the anterior side of the cervix, vaginal tissue normal except for a lesion that appears to be on the anterior side of the cervix where the cervix meets the vagina that appears to be a healing laceration              Cervix: no lesions and and clearly no blood is coming from the os              Bimanual Exam:  Uterus:  normal size, contour, position, consistency, mobility, non-tender              Adnexa: no mass, fullness, tenderness              Rectovaginal: No.              Anus:  normal sphincter tone, no lesions  Chaperone was present for exam.  Assessment: PMP bleeding that appears to be coming from a laceration that likely happened with her last episode of intercourse Recurrent UTIs Vaginal atrophic symptoms H/O  fibroids  Plan: Vaginal estrogen use and risks/side effects discussed.  Will plan to start with Premarin vaginal cream 1/2 gr pv twice weekly.  Pt aware to use for at least four to six weeks before backing off use.  Pt is aware that she needs to call with ANY more bleeding.  She declines having and endometrial biopsy and had a PUS one year ago but if had bleeding again, would have the PUS repeated and consider D&C if there is any change to the endometrial thickness.  Pt in full agreement with plan.  (No DVT hx in pt.)

## 2015-11-12 ENCOUNTER — Encounter: Payer: Self-pay | Admitting: Obstetrics & Gynecology

## 2015-11-12 LAB — URINE CULTURE
Colony Count: NO GROWTH
Organism ID, Bacteria: NO GROWTH

## 2016-06-16 ENCOUNTER — Ambulatory Visit: Payer: BLUE CROSS/BLUE SHIELD | Admitting: Certified Nurse Midwife

## 2016-08-03 ENCOUNTER — Other Ambulatory Visit: Payer: Self-pay | Admitting: Obstetrics & Gynecology

## 2016-08-03 DIAGNOSIS — Z1231 Encounter for screening mammogram for malignant neoplasm of breast: Secondary | ICD-10-CM

## 2016-08-05 ENCOUNTER — Ambulatory Visit (INDEPENDENT_AMBULATORY_CARE_PROVIDER_SITE_OTHER): Payer: PRIVATE HEALTH INSURANCE | Admitting: Certified Nurse Midwife

## 2016-08-05 ENCOUNTER — Encounter: Payer: Self-pay | Admitting: Certified Nurse Midwife

## 2016-08-05 VITALS — BP 120/70 | HR 68 | Resp 16 | Ht 65.5 in | Wt 144.0 lb

## 2016-08-05 DIAGNOSIS — Z124 Encounter for screening for malignant neoplasm of cervix: Secondary | ICD-10-CM | POA: Diagnosis not present

## 2016-08-05 DIAGNOSIS — Z780 Asymptomatic menopausal state: Secondary | ICD-10-CM

## 2016-08-05 DIAGNOSIS — N951 Menopausal and female climacteric states: Secondary | ICD-10-CM | POA: Diagnosis not present

## 2016-08-05 DIAGNOSIS — Z01419 Encounter for gynecological examination (general) (routine) without abnormal findings: Secondary | ICD-10-CM

## 2016-08-05 MED ORDER — MEDROXYPROGESTERONE ACETATE 10 MG PO TABS: 10.0000 mg | ORAL_TABLET | Freq: Every day | ORAL | 0 refills | Status: DC

## 2016-08-05 NOTE — Progress Notes (Signed)
61 y.o. G80P2002 Married  Caucasian Fe here for annual exam.Menopausal no vaginal bleeding occasional pink with sexual activity only. Now using Premarin cream every other day for dryness. Has tried coconut oil but not routinely. Continues to have UTI feeling after sexual activity, but has been seen at urgent care and urine negative..Denies urinary frequency or urgency or pain. Has Trimethoprim to use prn. No other health issues today. Declines screening labs at this point.  Patient's last menstrual period was 03/07/2011.          Sexually active: Yes.    The current method of family planning is tubal ligation.    Exercising: Yes.    running & weights Smoker:  no  Health Maintenance: Pap:  06-04-14 neg  MMG:  08-13-15 category c density birads 1:neg Colonoscopy:  1/08 neg f/u 64yrs BMD:   none TDaP:  2016 Shingles: no Pneumonia: no Hep C and HIV: both neg 2017 Labs: none Self breast exam: done occ   reports that she has never smoked. She has never used smokeless tobacco. She reports that she drinks alcohol. She reports that she does not use drugs.  Past Medical History:  Diagnosis Date  . Arthritis    fingers  . Chronic UTI (urinary tract infection)   . Fibroid   . Injury of fifth finger of right hand   . Lesion of bladder     Past Surgical History:  Procedure Laterality Date  . CESAREAN SECTION  1986  . CESAREAN SECTION W/BTL  1988  . CYSTOSCOPY W/ RETROGRADES  06/15/2011   Procedure: CYSTOSCOPY WITH RETROGRADE PYELOGRAM;  Surgeon: Molli Hazard, MD;  Location: [facility];  Service: Urology;  Laterality: Bilateral;  (BIL) RPG  . CYSTOSCOPY WITH BIOPSY  06/15/2011   Procedure: CYSTOSCOPY WITH BIOPSY;  Surgeon: Molli Hazard, MD;  Location: [facility];  Service: Urology;  Laterality: N/A;  BLADDER BIOPSY C-ARM CAMERA    . SALPINGOOPHORECTOMY  1991   LEFT    Current Outpatient Prescriptions  Medication Sig Dispense Refill  .  conjugated estrogens (PREMARIN) vaginal cream 1/2 gram vaginally twice weekly 30 g 1  . TRIMETHOPRIM PO Take by mouth as needed.     No current facility-administered medications for this visit.     Family History  Problem Relation Age of Onset  . Osteoporosis Mother   . Prostate cancer Father   . Diabetes Maternal Grandmother   . Heart attack Maternal Grandmother     ROS:  Pertinent items are noted in HPI.  Otherwise, a comprehensive ROS was negative.  Exam:   BP 120/70   Pulse 68   Resp 16   Ht 5' 5.5" (1.664 m)   Wt 144 lb (65.3 kg)   LMP 03/07/2011   BMI 23.60 kg/m  Height: 5' 5.5" (166.4 cm) Ht Readings from Last 3 Encounters:  08/05/16 5' 5.5" (1.664 m)  11/10/15 5\' 6"  (1.676 m)  06/11/15 5\' 6"  (1.676 m)    General appearance: alert, cooperative and appears stated age Head: Normocephalic, without obvious abnormality, atraumatic Neck: no adenopathy, supple, symmetrical, trachea midline and thyroid normal to inspection and palpation Lungs: clear to auscultation bilaterally Breasts: normal appearance, no masses or tenderness, No nipple retraction or dimpling, No nipple discharge or bleeding, No axillary or supraclavicular adenopathy Heart: regular rate and rhythm Abdomen: soft, non-tender; no masses,  no organomegaly Extremities: extremities normal, atraumatic, no cyanosis or edema Skin: Skin color, texture, turgor normal. No rashes or lesions Lymph  nodes: Cervical, supraclavicular, and axillary nodes normal. No abnormal inguinal nodes palpated Neurologic: Grossly normal   Pelvic: External genitalia:  no lesions, slight dry appearance, no scaling or redness              Urethra:  normal appearing urethra with no masses, tenderness or lesions              Bartholin's and Skene's: normal                 Vagina: normal appearing vagina with normal color and moisture , no lesions, dryness noted at introitus only,              Cervix: no bleeding following Pap, no  cervical motion tenderness and no lesions              Pap taken: Yes.   Bimanual Exam:  Uterus:  normal and nodular feel, history of fibroids              Adnexa: normal adnexa and no mass, fullness, tenderness               Rectovaginal: Confirms               Anus:  normal sphincter tone, no lesions  Chaperone present: yes  A:  Well Woman with normal exam  Menopausal no HRT  Vaginal dryness,mainly noted at introitus  Premarin use for dryness but outside normal parameters for use  History of fibroids no change in uterine size  P:   Reviewed health and wellness pertinent to exam  Discussed importance of notifying if vaginal bleeding   Discussed area of dryness noted and recommended coconut oil for moisture as well as protection on daily basis. Instructions given. Will try and advise. Also discussed putting tiny amount of Premarin cream around urethral meatus to see if this would stop the urinary symptom.   Discussed Premarin vaginal cream with daily use can increase risk of hyperplasia due to no progesterone use to offset. Recommended Provera use to make sure no concerns. Instructed to call if vaginal bleeding occurs with or without use. Questions addressed. Also discussed possible Estring use to avoid excessive use. Risks/benefits discussed. Patient will consider.  Rx Provera see order with instructions  Pap smear as above    counseled on breast self exam, mammography screening, adequate intake of calcium and vitamin D, diet and exercise, Kegel's exercises  return annually or prn  An After Visit Summary was printed and given to the patient.

## 2016-08-05 NOTE — Patient Instructions (Signed)

## 2016-08-06 ENCOUNTER — Encounter: Payer: Self-pay | Admitting: Certified Nurse Midwife

## 2016-08-10 LAB — IPS PAP TEST WITH HPV

## 2016-08-11 NOTE — Progress Notes (Signed)
Encounter reviewed Shanta Hartner, MD   

## 2016-08-14 ENCOUNTER — Other Ambulatory Visit: Payer: Self-pay | Admitting: Obstetrics & Gynecology

## 2016-08-15 NOTE — Telephone Encounter (Signed)
Medication refill request: Conjugated Estrogens Last AEX:  08/05/16 DL Next AEX: 08/09/17 DL Last MMG (if hormonal medication request): 08/13/15 BIRADS1, Density C, TBC (scheduled for 08/26/16) Refill authorized: 11/10/15 #30g 1R. Please advise. Thank you.   Routed to [facility]

## 2016-08-26 ENCOUNTER — Ambulatory Visit
Admission: RE | Admit: 2016-08-26 | Discharge: 2016-08-26 | Disposition: A | Discharge status: Home / Self Care | Payer: PRIVATE HEALTH INSURANCE | Source: Ambulatory Visit | Attending: Obstetrics & Gynecology | Admitting: Obstetrics & Gynecology

## 2016-08-26 DIAGNOSIS — Z1231 Encounter for screening mammogram for malignant neoplasm of breast: Secondary | ICD-10-CM

## 2017-02-16 ENCOUNTER — Emergency Department (HOSPITAL_COMMUNITY)
Admission: EM | Admit: 2017-02-16 | Discharge: 2017-02-16 | Disposition: A | Discharge status: Home / Self Care | Payer: PRIVATE HEALTH INSURANCE | Attending: Emergency Medicine | Admitting: Emergency Medicine

## 2017-02-16 ENCOUNTER — Encounter (HOSPITAL_COMMUNITY): Payer: Self-pay | Admitting: Emergency Medicine

## 2017-02-16 DIAGNOSIS — R55 Syncope and collapse: Secondary | ICD-10-CM | POA: Insufficient documentation

## 2017-02-16 LAB — BASIC METABOLIC PANEL
Anion gap: 9 (ref 5–15)
BUN: 16 mg/dL (ref 6–20)
CO2: 25 mmol/L (ref 22–32)
Calcium: 9.4 mg/dL (ref 8.9–10.3)
Chloride: 104 mmol/L (ref 101–111)
Creatinine, Ser: 0.86 mg/dL (ref 0.44–1.00)
GFR calc Af Amer: >60 mL/min (ref >60)
GFR calc non Af Amer: >60 mL/min (ref >60)
Glucose, Bld: 96 mg/dL (ref 65–99)
Potassium: 3.9 mmol/L (ref 3.5–5.1)
Sodium: 138 mmol/L (ref 135–145)

## 2017-02-16 LAB — URINALYSIS, ROUTINE W REFLEX MICROSCOPIC
Bilirubin Urine: NEGATIVE
Glucose, UA: NEGATIVE mg/dL
Hgb urine dipstick: NEGATIVE
Ketones, ur: NEGATIVE mg/dL
Leukocytes, UA: NEGATIVE
Nitrite: NEGATIVE
Protein, ur: NEGATIVE mg/dL
Specific Gravity, Urine: 1.01 (ref 1.005–1.030)
pH: 6 (ref 5.0–8.0)

## 2017-02-16 LAB — CBC WITH DIFFERENTIAL/PLATELET
Basophils Absolute: 0 10*3/uL (ref 0.0–0.1)
Basophils Relative: 0 %
Eosinophils Absolute: 0 10*3/uL (ref 0.0–0.7)
Eosinophils Relative: 1 %
HCT: 41.3 % (ref 36.0–46.0)
Hemoglobin: 14.1 g/dL (ref 12.0–15.0)
Lymphocytes Relative: 13 %
Lymphs Abs: 1 10*3/uL (ref 0.7–4.0)
MCH: 31.1 pg (ref 26.0–34.0)
MCHC: 34.1 g/dL (ref 30.0–36.0)
MCV: 91.2 fL (ref 78.0–100.0)
Monocytes Absolute: 0.4 10*3/uL (ref 0.1–1.0)
Monocytes Relative: 6 %
Neutro Abs: 6 10*3/uL (ref 1.7–7.7)
Neutrophils Relative %: 80 %
Platelets: 198 10*3/uL (ref 150–400)
RBC: 4.53 MIL/uL (ref 3.87–5.11)
RDW: 12.6 % (ref 11.5–15.5)
WBC: 7.5 10*3/uL (ref 4.0–10.5)

## 2017-02-16 MED ORDER — SODIUM CHLORIDE 0.9 % IV BOLUS (SEPSIS)
1000.0000 mL | Freq: Once | INTRAVENOUS | Status: AC
  Administered 2017-02-16: 1000 mL via INTRAVENOUS
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
## 2017-02-16 NOTE — Discharge Instructions (Signed)
Return here for any worsening in your condition.  Follow-up with the cardiologist provided
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
## 2017-02-16 NOTE — ED Notes (Signed)
Pt ambulated in the hallway, no distress/dizziness noted.
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
## 2017-02-20 ENCOUNTER — Encounter: Payer: Self-pay | Admitting: Cardiology

## 2017-02-20 ENCOUNTER — Ambulatory Visit (INDEPENDENT_AMBULATORY_CARE_PROVIDER_SITE_OTHER): Payer: PRIVATE HEALTH INSURANCE | Admitting: Cardiology

## 2017-02-20 VITALS — BP 118/72 | HR 78 | Ht 65.5 in | Wt 146.0 lb

## 2017-02-20 DIAGNOSIS — R55 Syncope and collapse: Secondary | ICD-10-CM | POA: Diagnosis not present

## 2017-02-20 DIAGNOSIS — I471 Supraventricular tachycardia: Secondary | ICD-10-CM | POA: Diagnosis not present

## 2017-02-20 NOTE — Patient Instructions (Signed)
Medication Instructions:   NO CHANGE  Testing/Procedures:  Your physician has requested that you have an echocardiogram. Echocardiography is a painless test that uses sound waves to create images of your heart. It provides your doctor with information about the size and shape of your heart and how well your heart's chambers and valves are working. This procedure takes approximately one hour. There are no restrictions for this procedure.    Follow-Up:  Your physician recommends that you schedule a follow-up appointment in: 3 MONTHS WITH DR CRENSHAW  REFERRAL TO EP FOR SVT AND SYNCOPE

## 2017-02-23 ENCOUNTER — Encounter: Payer: Self-pay | Admitting: Family Medicine

## 2017-02-28 ENCOUNTER — Ambulatory Visit (HOSPITAL_COMMUNITY): Payer: PRIVATE HEALTH INSURANCE | Attending: Cardiology

## 2017-02-28 ENCOUNTER — Other Ambulatory Visit: Payer: Self-pay

## 2017-02-28 DIAGNOSIS — R55 Syncope and collapse: Secondary | ICD-10-CM | POA: Diagnosis present

## 2017-02-28 DIAGNOSIS — I341 Nonrheumatic mitral (valve) prolapse: Secondary | ICD-10-CM | POA: Insufficient documentation

## 2017-02-28 DIAGNOSIS — I471 Supraventricular tachycardia: Secondary | ICD-10-CM | POA: Insufficient documentation

## 2017-03-06 ENCOUNTER — Institutional Professional Consult (permissible substitution): Payer: PRIVATE HEALTH INSURANCE | Admitting: Cardiology

## 2017-03-07 ENCOUNTER — Encounter: Payer: Self-pay | Admitting: Family Medicine

## 2017-03-07 ENCOUNTER — Ambulatory Visit (INDEPENDENT_AMBULATORY_CARE_PROVIDER_SITE_OTHER): Payer: PRIVATE HEALTH INSURANCE | Admitting: Family Medicine

## 2017-03-07 ENCOUNTER — Ambulatory Visit (INDEPENDENT_AMBULATORY_CARE_PROVIDER_SITE_OTHER): Payer: PRIVATE HEALTH INSURANCE | Admitting: Internal Medicine

## 2017-03-07 ENCOUNTER — Encounter: Payer: Self-pay | Admitting: Internal Medicine

## 2017-03-07 VITALS — BP 121/72 | HR 88 | Ht 67.0 in | Wt 147.0 lb

## 2017-03-07 VITALS — BP 124/76 | HR 74 | Ht 67.0 in | Wt 145.4 lb

## 2017-03-07 DIAGNOSIS — S060X1D Concussion with loss of consciousness of 30 minutes or less, subsequent encounter: Secondary | ICD-10-CM

## 2017-03-07 DIAGNOSIS — R55 Syncope and collapse: Secondary | ICD-10-CM | POA: Insufficient documentation

## 2017-03-07 MED ORDER — METOPROLOL TARTRATE 25 MG PO TABS
25.0000 mg | ORAL_TABLET | Freq: Four times a day (QID) | ORAL | 3 refills | Status: DC | PRN
Start: 2017-03-07 — End: 2017-05-24
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
## 2017-03-07 NOTE — Patient Instructions (Signed)
WE NOW OFFER   [facility] FAST TRACK!!!  SAME DAY Appointments for ACUTE CARE  Such as: Sprains, Injuries, cuts, abrasions, rashes, muscle pain, joint pain, back pain Colds, flu, sore throats, headache, allergies, cough, fever  Ear pain, sinus and eye infections Abdominal pain, nausea, vomiting, diarrhea, upset stomach Animal/insect bites  3 Easy Ways to Schedule: Walk-In Scheduling Call in scheduling Mychart Sign-up: [email or web address]         
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
## 2017-03-07 NOTE — Patient Instructions (Addendum)
Medication Instructions:  Your physician has recommended you make the following change in your medication:  1.  Take metoprolol 25 mg one tablet by mouth every 6 hours as needed.  Labwork: None ordered.  Testing/Procedures: Your physician has recommended that you have an ablation. Catheter ablation is a medical procedure used to treat some cardiac arrhythmias (irregular heartbeats). During catheter ablation, a long, thin, flexible tube is put into a blood vessel in your groin (upper thigh), or neck. This tube is called an ablation catheter. It is then guided to your heart through the blood vessel. Radio frequency waves destroy small areas of heart tissue where abnormal heartbeats may cause an arrhythmia to start. Please see the instruction sheet given to you today.  Follow-Up: When you are able to catch your supraventricular tachycardia on your monitor please call me. Kimberly Hawk RN [phone]  Any Other Special Instructions Will Be Listed Below (If Applicable).   Cardiac Ablation Cardiac ablation is a procedure to disable (ablate) a small amount of heart tissue in very specific places. The heart has many electrical connections. Sometimes these connections are abnormal and can cause the heart to beat very fast or irregularly. Ablating some of the problem areas can improve the heart rhythm or return it to normal. Ablation may be done for people who:  Have Wolff-Parkinson-White syndrome.  Have fast heart rhythms (tachycardia).  Have taken medicines for an abnormal heart rhythm (arrhythmia) that were not effective or caused side effects.  Have a high-risk heartbeat that may be life-threatening.  During the procedure, a small incision is made in the neck or the groin, and a long, thin, flexible tube (catheter) is inserted into the incision and moved to the heart. Small devices (electrodes) on the tip of the catheter will send out electrical currents. A type of X-ray (fluoroscopy) will be  used to help guide the catheter and to provide images of the heart. Tell a health care provider about:  Any allergies you have.  All medicines you are taking, including vitamins, herbs, eye drops, creams, and over-the-counter medicines.  Any problems you or family members have had with anesthetic medicines.  Any blood disorders you have.  Any surgeries you have had.  Any medical conditions you have, such as kidney failure.  Whether you are pregnant or may be pregnant. What are the risks? Generally, this is a safe procedure. However, problems may occur, including:  Infection.  Bruising and bleeding at the catheter insertion site.  Bleeding into the chest, especially into the sac that surrounds the heart. This is a serious complication.  Stroke or blood clots.  Damage to other structures or organs.  Allergic reaction to medicines or dyes.  Need for a permanent pacemaker if the normal electrical system is damaged. A pacemaker is a small computer that sends electrical signals to the heart and helps your heart beat normally.  The procedure not being fully effective. This may not be recognized until months later. Repeat ablation procedures are sometimes required.  What happens before the procedure?  Follow instructions from your health care provider about eating or drinking restrictions.  Ask your health care provider about: ? Changing or stopping your regular medicines. This is especially important if you are taking diabetes medicines or blood thinners. ? Taking medicines such as aspirin and ibuprofen. These medicines can thin your blood. Do not take these medicines before your procedure if your health care provider instructs you not to.  Plan to have someone take you home from  the hospital or clinic.  If you will be going home right after the procedure, plan to have someone with you for 24 hours. What happens during the procedure?  To lower your risk of infection: ? Your  health care team will wash or sanitize their hands. ? Your skin will be washed with soap. ? Hair may be removed from the incision area.  An IV tube will be inserted into one of your veins.  You will be given a medicine to help you relax (sedative).  The skin on your neck or groin will be numbed.  An incision will be made in your neck or your groin.  A needle will be inserted through the incision and into a large vein in your neck or groin.  A catheter will be inserted into the needle and moved to your heart.  Dye may be injected through the catheter to help your surgeon see the area of the heart that needs treatment.  Electrical currents will be sent from the catheter to ablate heart tissue in desired areas. There are three types of energy that may be used to ablate heart tissue: ? Heat (radiofrequency energy). ? Laser energy. ? Extreme cold (cryoablation).  When the necessary tissue has been ablated, the catheter will be removed.  Pressure will be held on the catheter insertion area to prevent excessive bleeding.  A bandage (dressing) will be placed over the catheter insertion area. The procedure may vary among health care providers and hospitals. What happens after the procedure?  Your blood pressure, heart rate, breathing rate, and blood oxygen level will be monitored until the medicines you were given have worn off.  Your catheter insertion area will be monitored for bleeding. You will need to lie still for a few hours to ensure that you do not bleed from the catheter insertion area.  Do not drive for 24 hours or as long as directed by your health care provider. Summary  Cardiac ablation is a procedure to disable (ablate) a small amount of heart tissue in very specific places. Ablating some of the problem areas can improve the heart rhythm or return it to normal.  During the procedure, electrical currents will be sent from the catheter to ablate heart tissue in desired  areas. This information is not intended to replace advice given to you by your health care provider. Make sure you discuss any questions you have with your health care provider. Document Released: 10/09/2008 Document Revised: 04/11/2016 Document Reviewed: 04/11/2016 Elsevier Interactive Patient Education  [employer].   If you need a refill on your cardiac medications before your next appointment, please call your pharmacy.

## 2017-03-15 ENCOUNTER — Ambulatory Visit (INDEPENDENT_AMBULATORY_CARE_PROVIDER_SITE_OTHER)
Admission: RE | Admit: 2017-03-15 | Discharge: 2017-03-15 | Disposition: A | Discharge status: Home / Self Care | Payer: PRIVATE HEALTH INSURANCE | Source: Ambulatory Visit | Attending: Family Medicine | Admitting: Family Medicine

## 2017-03-15 DIAGNOSIS — R55 Syncope and collapse: Secondary | ICD-10-CM

## 2017-03-15 MED ORDER — IOPAMIDOL (ISOVUE-300) INJECTION 61%
80.0000 mL | Freq: Once | INTRAVENOUS | Status: AC | PRN
  Administered 2017-03-15: 80 mL via INTRAVENOUS

## 2017-03-20 ENCOUNTER — Telehealth: Payer: Self-pay

## 2017-03-20 NOTE — Telephone Encounter (Signed)
Call received from Pt. Per Pt she may have caught SVT on her device, will attempt to send strips via MyChart.  Pt wishes to go ahead and set up loop implant just in case these strips don't have documentation that Dr. Lovena Le needs.  Pt set up for 04/06/2017 @ 12:00 pm, arrival time 11:00 am.  Will send confirmation to Pt via MyChart.

## 2017-04-06 ENCOUNTER — Encounter (HOSPITAL_COMMUNITY)
Admission: RE | Disposition: A | Discharge status: Home / Self Care | Payer: Self-pay | Source: Ambulatory Visit | Attending: Internal Medicine

## 2017-04-06 ENCOUNTER — Ambulatory Visit (HOSPITAL_COMMUNITY)
Admission: RE | Admit: 2017-04-06 | Discharge: 2017-04-06 | Disposition: A | Discharge status: Home / Self Care | Payer: PRIVATE HEALTH INSURANCE | Source: Ambulatory Visit | Attending: Internal Medicine | Admitting: Internal Medicine

## 2017-04-06 ENCOUNTER — Encounter (HOSPITAL_COMMUNITY): Payer: Self-pay | Admitting: Internal Medicine

## 2017-04-06 DIAGNOSIS — M19049 Primary osteoarthritis, unspecified hand: Secondary | ICD-10-CM | POA: Diagnosis not present

## 2017-04-06 DIAGNOSIS — I471 Supraventricular tachycardia: Secondary | ICD-10-CM | POA: Insufficient documentation

## 2017-04-06 DIAGNOSIS — R55 Syncope and collapse: Secondary | ICD-10-CM | POA: Diagnosis not present

## 2017-04-06 HISTORY — PX: LOOP RECORDER INSERTION: EP1214

## 2017-04-06 SURGERY — LOOP RECORDER INSERTION
Anesthesia: LOCAL

## 2017-04-06 MED ORDER — LIDOCAINE-EPINEPHRINE 1 %-1:100000 IJ SOLN
INTRAMUSCULAR | Status: DC | PRN
  Administered 2017-04-06: 20 mL

## 2017-04-06 MED ORDER — LIDOCAINE-EPINEPHRINE 1 %-1:100000 IJ SOLN
INTRAMUSCULAR | Status: AC
  Filled 2017-04-06: qty 1

## 2017-04-06 SURGICAL SUPPLY — 2 items
LOOP REVEAL LINQSYS (Prosthesis & Implant Heart) ×1 IMPLANT
PACK LOOP INSERTION (CUSTOM PROCEDURE TRAY) ×2 IMPLANT
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
## 2017-04-06 NOTE — Discharge Instructions (Signed)
See extra paper for DC for loop recorder implant

## 2017-04-06 NOTE — Interval H&P Note (Signed)
History and Physical Interval Note:  04/06/2017 12:31 PM  Kimberly Wheeler  has presented today for surgery, with the diagnosis of syncope   svt  The various methods of treatment have been discussed with the patient and family. After consideration of risks, benefits and other options for treatment, the patient has consented to  Procedure(s): LOOP RECORDER INSERTION (N/A) as a surgical intervention .  The patient's history has been reviewed, patient examined, no change in status, stable for surgery.  I have reviewed the patient's chart and labs.  Questions were answered to the patient's satisfaction.     [name]

## 2017-04-17 ENCOUNTER — Ambulatory Visit (INDEPENDENT_AMBULATORY_CARE_PROVIDER_SITE_OTHER): Payer: Self-pay | Admitting: *Deleted

## 2017-04-17 DIAGNOSIS — R55 Syncope and collapse: Secondary | ICD-10-CM

## 2017-04-17 LAB — CUP PACEART INCLINIC DEVICE CHECK
Implantable Pulse Generator Implant Date: 20181101
MDC IDC SESS DTM: 20181112130842

## 2017-04-17 NOTE — Progress Notes (Signed)
Wound check in clinic s/p ILR implant. Steri strips removed prior to implant. Wound well healed without redness or edema. Incision edges approximated. Normal ILR device function. Battery status: Good. R-waves 1.97mV. 0 symptom episodes, 0 tachy episodes, 0 pause episodes, 0 brady episodes. 0 AF episodes (0% burden). Monthly summary reports and ROV with GT in 3 months. Patient education completed including remote monitoring, wound care, and use of symptom activator.

## 2017-05-05 ENCOUNTER — Telehealth: Payer: Self-pay | Admitting: *Deleted

## 2017-05-05 NOTE — Telephone Encounter (Signed)
Spoke with patient regarding symptom+ detected episode. Tachy episode duration 28 minutes, Max V rate 240 bpm. Patient states she was warming up at the gym on the row machine when she felt her heart racing, lightheaded, and nauseous. She states she laid down on the floor with her feet up and her trainer checked her HR 200-240 bpm. She then took a metoprolol tartrate 25 mg and continued to lay down until it passed. Patient states she did not pass out. Advised patient I will review the episode with Dr. Lovena Le and give her a call back this afternoon. Patient verbalized understanding and appreciation.

## 2017-05-05 NOTE — Telephone Encounter (Signed)
Reviewed Episode with Dr. Lovena Le, recommends scheduling appointment with him as soon as possible. Advised patient I will send Lorenda Hatchet a message and she will give her a call to schedule the appointment with Dr. Lovena Le. Patient was very Patent attorney.

## 2017-05-08 ENCOUNTER — Ambulatory Visit (INDEPENDENT_AMBULATORY_CARE_PROVIDER_SITE_OTHER): Payer: PRIVATE HEALTH INSURANCE | Admitting: *Deleted

## 2017-05-08 DIAGNOSIS — R55 Syncope and collapse: Secondary | ICD-10-CM

## 2017-05-08 NOTE — Progress Notes (Signed)
Carelink Summary Report / Loop Recorder 

## 2017-05-08 NOTE — Telephone Encounter (Signed)
Call placed to Pt.  Notified that Lenna Sciara will be calling her to schedule f/u with Dr. Lovena Le for possible ablation.  Pt out of town tomorrow, available Dec 5, 12 or 14th.  Scheduling notified.

## 2017-05-10 ENCOUNTER — Encounter: Payer: Self-pay | Admitting: Internal Medicine

## 2017-05-10 ENCOUNTER — Ambulatory Visit (INDEPENDENT_AMBULATORY_CARE_PROVIDER_SITE_OTHER): Payer: PRIVATE HEALTH INSURANCE | Admitting: Internal Medicine

## 2017-05-10 VITALS — BP 94/68 | HR 78 | Ht 67.0 in | Wt 149.2 lb

## 2017-05-10 DIAGNOSIS — R55 Syncope and collapse: Secondary | ICD-10-CM

## 2017-05-10 DIAGNOSIS — I471 Supraventricular tachycardia: Secondary | ICD-10-CM

## 2017-05-10 DIAGNOSIS — Z01812 Encounter for preprocedural laboratory examination: Secondary | ICD-10-CM

## 2017-05-10 NOTE — H&P (View-Only) (Signed)
HPI Kimberly Wheeler returns today for ongoing evaluation of syncope.  She is a very pleasant 61 year old woman with a history of tachypalpitations for many years but never documented episodes of SVT.  The patient has worn a cardiac monitor in the past.  She had an episode of syncope several months ago.  She underwent insertion of an implantable loop recorder.  She has had recurrent SVT at rates of over 200 bpm.  These episodes have been documented on her implantable loop recorder. Allergies  Allergen Reactions  . Darvon Nausea And Vomiting  . Macrodantin [Nitrofurantoin] Nausea Only     Current Outpatient Medications  Medication Sig Dispense Refill  . acetaminophen (TYLENOL) 500 MG tablet Take 1,000 mg by mouth every 6 (six) hours as needed for mild pain.    Marland Kitchen CALCIUM PO Take 1 tablet by mouth daily.    . magnesium 30 MG tablet Take 30 mg by mouth daily.    . metoprolol tartrate (LOPRESSOR) 25 MG tablet Take 1 tablet (25 mg total) by mouth every 6 (six) hours as needed (fast HR). 30 tablet 3   No current facility-administered medications for this visit.      Past Medical History:  Diagnosis Date  . Arthritis    fingers  . Chronic UTI (urinary tract infection)   . Fibroid   . Injury of fifth finger of right hand   . Lesion of bladder   . SVT (supraventricular tachycardia) (HCC)     ROS:   All systems reviewed and negative except as noted in the HPI.   Past Surgical History:  Procedure Laterality Date  . CESAREAN SECTION  1986  . CESAREAN SECTION W/BTL  1988  . CYSTOSCOPY W/ RETROGRADES  06/15/2011   Procedure: CYSTOSCOPY WITH RETROGRADE PYELOGRAM;  Surgeon: Molli Hazard, MD;  Location[facility];  Service: Urology;  Laterality: Bilateral;  (BIL) RPG  . CYSTOSCOPY WITH BIOPSY  06/15/2011   Procedure: CYSTOSCOPY WITH BIOPSY;  Surgeon: Molli Hazard, MD;  Location: [facility];  Service: Urology;  Laterality: N/A;  BLADDER  BIOPSY C-ARM CAMERA    . LOOP RECORDER INSERTION N/A 04/06/2017   Procedure: LOOP RECORDER INSERTION;  Surgeon: Evans Lance, MD;  Location: [facility];  Service: Cardiovascular;  Laterality: N/A;  . SALPINGOOPHORECTOMY  1991   LEFT     Family History  Problem Relation Age of Onset  . Osteoporosis Mother   . Prostate cancer Father   . Diabetes Maternal Grandmother   . Heart attack Maternal Grandmother   . Breast cancer Maternal Aunt      Social History   Socioeconomic History  . Marital status: Married    Spouse name: Not on file  . Number of children: 2  . Years of education: Not on file  . Highest education level: Not on file  Social Needs  . Financial resource strain: Not on file  . Food insecurity - worry: Not on file  . Food insecurity - inability: Not on file  . Transportation needs - medical: Not on file  . Transportation needs - non-medical: Not on file  Occupational History    Comment: [employer]  Tobacco Use  . Smoking status: Never Smoker  . Smokeless tobacco: Never Used  Substance and Sexual Activity  . Alcohol use: Yes    Comment: 3-5  . Drug use: No  . Sexual activity: Yes    Partners: Male  Birth control/protection: Surgical    Comment: Tubal Ligation  Other Topics Concern  . Not on file  Social History Narrative  . Not on file     BP 94/68   Pulse 78   Ht 5\' 7"  (1.702 m)   Wt 149 lb 3.2 oz (67.7 kg)   LMP 03/07/2011   SpO2 99%   BMI 23.37 kg/m   Physical Exam:  Well appearing middle-aged woman, NAD HEENT: Unremarkable Neck: 6 cm JVD, no thyromegally Lymphatics:  No adenopathy Back:  No CVA tenderness Lungs:  Clear, with no wheezes, rales, or rhonchi trace peripheral HEART:  Regular rate rhythm, no murmurs, no rubs, no clicks Abd:  soft, positive bowel sounds, no organomegally, no rebound, no guarding Ext:  2 plus pulses,  edema, no cyanosis, no clubbing Skin:  No rashes no nodules Neuro:  CN II through  XII intact, motor grossly intact  EKG -normal sinus rhythm with no ventricular preexcitation DEVICE  Normal device function.  See PaceArt for details.  SVT at 200 beats a minute  Assess/Plan:  1.  SVT -the risks, goals, benefits, and expectations of catheter ablation were reviewed in detail and she wishes to proceed. 2.  Syncope -if she can undergo successful ablation, I would release her to drive in 1 week.  Elmo Shumard,M.D.  I have over 45 minutes spent to produce this note including 50% face-to-face time.

## 2017-05-10 NOTE — Patient Instructions (Addendum)
Medication Instructions:  Your physician recommends that you continue on your current medications as directed. Please refer to the Current Medication list given to you today.  Labwork: You will get lab work today:  BMP and CBC.  Testing/Procedures: Your physician has recommended that you have an ablation. Catheter ablation is a medical procedure used to treat some cardiac arrhythmias (irregular heartbeats). During catheter ablation, a long, thin, flexible tube is put into a blood vessel in your groin (upper thigh), or neck. This tube is called an ablation catheter. It is then guided to your heart through the blood vessel. Radio frequency waves destroy small areas of heart tissue where abnormal heartbeats may cause an arrhythmia to start. Please see the instruction sheet given to you today.  Follow-Up: You will follow up with Dr. Lovena Le 4 weeks after your procedure.  Any Other Special Instructions Will Be Listed Below (If Applicable).  Please arrive at the [facility] main entrance of [facility] at:  6:30 am on May 24, 2017  Do not eat or drink after midnight prior to procedure  Do not take any medications the morning of the procedure-do not take your PRN metoprolol 2 days prior to your procedure  Plan for one night stay-but you may be discharged.  You will need someone to drive you home at discharge.    If you need a refill on your cardiac medications before your next appointment, please call your pharmacy.   Cardiac Ablation Cardiac ablation is a procedure to disable (ablate) a small amount of heart tissue in very specific places. The heart has many electrical connections. Sometimes these connections are abnormal and can cause the heart to beat very fast or irregularly. Ablating some of the problem areas can improve the heart rhythm or return it to normal. Ablation may be done for people who:  Have Wolff-Parkinson-White syndrome.  Have fast heart rhythms  (tachycardia).  Have taken medicines for an abnormal heart rhythm (arrhythmia) that were not effective or caused side effects.  Have a high-risk heartbeat that may be life-threatening.  During the procedure, a small incision is made in the neck or the groin, and a long, thin, flexible tube (catheter) is inserted into the incision and moved to the heart. Small devices (electrodes) on the tip of the catheter will send out electrical currents. A type of X-ray (fluoroscopy) will be used to help guide the catheter and to provide images of the heart. Tell a health care provider about:  Any allergies you have.  All medicines you are taking, including vitamins, herbs, eye drops, creams, and over-the-counter medicines.  Any problems you or family members have had with anesthetic medicines.  Any blood disorders you have.  Any surgeries you have had.  Any medical conditions you have, such as kidney failure.  Whether you are pregnant or may be pregnant. What are the risks? Generally, this is a safe procedure. However, problems may occur, including:  Infection.  Bruising and bleeding at the catheter insertion site.  Bleeding into the chest, especially into the sac that surrounds the heart. This is a serious complication.  Stroke or blood clots.  Damage to other structures or organs.  Allergic reaction to medicines or dyes.  Need for a permanent pacemaker if the normal electrical system is damaged. A pacemaker is a small computer that sends electrical signals to the heart and helps your heart beat normally.  The procedure not being fully effective. This may not be recognized until months later. Repeat  ablation procedures are sometimes required.  What happens before the procedure?  Follow instructions from your health care provider about eating or drinking restrictions.  Ask your health care provider about: ? Changing or stopping your regular medicines. This is especially important if  you are taking diabetes medicines or blood thinners. ? Taking medicines such as aspirin and ibuprofen. These medicines can thin your blood. Do not take these medicines before your procedure if your health care provider instructs you not to.  Plan to have someone take you home from the hospital or clinic.  If you will be going home right after the procedure, plan to have someone with you for 24 hours. What happens during the procedure?  To lower your risk of infection: ? Your health care team will wash or sanitize their hands. ? Your skin will be washed with soap. ? Hair may be removed from the incision area.  An IV tube will be inserted into one of your veins.  You will be given a medicine to help you relax (sedative).  The skin on your neck or groin will be numbed.  An incision will be made in your neck or your groin.  A needle will be inserted through the incision and into a large vein in your neck or groin.  A catheter will be inserted into the needle and moved to your heart.  Dye may be injected through the catheter to help your surgeon see the area of the heart that needs treatment.  Electrical currents will be sent from the catheter to ablate heart tissue in desired areas. There are three types of energy that may be used to ablate heart tissue: ? Heat (radiofrequency energy). ? Laser energy. ? Extreme cold (cryoablation).  When the necessary tissue has been ablated, the catheter will be removed.  Pressure will be held on the catheter insertion area to prevent excessive bleeding.  A bandage (dressing) will be placed over the catheter insertion area. The procedure may vary among health care providers and hospitals. What happens after the procedure?  Your blood pressure, heart rate, breathing rate, and blood oxygen level will be monitored until the medicines you were given have worn off.  Your catheter insertion area will be monitored for bleeding. You will need to lie  still for a few hours to ensure that you do not bleed from the catheter insertion area.  Do not drive for 24 hours or as long as directed by your health care provider. Summary  Cardiac ablation is a procedure to disable (ablate) a small amount of heart tissue in very specific places. Ablating some of the problem areas can improve the heart rhythm or return it to normal.  During the procedure, electrical currents will be sent from the catheter to ablate heart tissue in desired areas. This information is not intended to replace advice given to you by your health care provider. Make sure you discuss any questions you have with your health care provider. Document Released: 10/09/2008 Document Revised: 04/11/2016 Document Reviewed: 04/11/2016 Elsevier Interactive Patient Education  [employer].

## 2017-05-10 NOTE — Progress Notes (Signed)
HPI Ms. Kimberly Wheeler returns today for ongoing evaluation of syncope.  She is a very pleasant 61 year old woman with a history of tachypalpitations for many years but never documented episodes of SVT.  The patient has worn a cardiac monitor in the past.  She had an episode of syncope several months ago.  She underwent insertion of an implantable loop recorder.  She has had recurrent SVT at rates of over 200 bpm.  These episodes have been documented on her implantable loop recorder. Allergies  Allergen Reactions  . Darvon Nausea And Vomiting  . Macrodantin [Nitrofurantoin] Nausea Only     Current Outpatient Medications  Medication Sig Dispense Refill  . acetaminophen (TYLENOL) 500 MG tablet Take 1,000 mg by mouth every 6 (six) hours as needed for mild pain.    Marland Kitchen CALCIUM PO Take 1 tablet by mouth daily.    . magnesium 30 MG tablet Take 30 mg by mouth daily.    . metoprolol tartrate (LOPRESSOR) 25 MG tablet Take 1 tablet (25 mg total) by mouth every 6 (six) hours as needed (fast HR). 30 tablet 3   No current facility-administered medications for this visit.      Past Medical History:  Diagnosis Date  . Arthritis    fingers  . Chronic UTI (urinary tract infection)   . Fibroid   . Injury of fifth finger of right hand   . Lesion of bladder   . SVT (supraventricular tachycardia) (HCC)     ROS:   All systems reviewed and negative except as noted in the HPI.   Past Surgical History:  Procedure Laterality Date  . CESAREAN SECTION  1986  . CESAREAN SECTION W/BTL  1988  . CYSTOSCOPY W/ RETROGRADES  06/15/2011   Procedure: CYSTOSCOPY WITH RETROGRADE PYELOGRAM;  Surgeon: Molli Hazard, MD;  Location: [facility];  Service: Urology;  Laterality: Bilateral;  (BIL) RPG  . CYSTOSCOPY WITH BIOPSY  06/15/2011   Procedure: CYSTOSCOPY WITH BIOPSY;  Surgeon: Molli Hazard, MD;  Location: [facility];  Service: Urology;  Laterality: N/A;  BLADDER  BIOPSY C-ARM CAMERA    . LOOP RECORDER INSERTION N/A 04/06/2017   Procedure: LOOP RECORDER INSERTION;  Surgeon: Evans Lance, MD;  Location: [facility];  Service: Cardiovascular;  Laterality: N/A;  . SALPINGOOPHORECTOMY  1991   LEFT     Family History  Problem Relation Age of Onset  . Osteoporosis Mother   . Prostate cancer Father   . Diabetes Maternal Grandmother   . Heart attack Maternal Grandmother   . Breast cancer Maternal Aunt      Social History   Socioeconomic History  . Marital status: Married    Spouse name: Not on file  . Number of children: 2  . Years of education: Not on file  . Highest education level: Not on file  Social Needs  . Financial resource strain: Not on file  . Food insecurity - worry: Not on file  . Food insecurity - inability: Not on file  . Transportation needs - medical: Not on file  . Transportation needs - non-medical: Not on file  Occupational History    Comment: [employer]  Tobacco Use  . Smoking status: Never Smoker  . Smokeless tobacco: Never Used  Substance and Sexual Activity  . Alcohol use: Yes    Comment: 3-5  . Drug use: No  . Sexual activity: Yes    Partners: Male  Birth control/protection: Surgical    Comment: Tubal Ligation  Other Topics Concern  . Not on file  Social History Narrative  . Not on file     BP 94/68   Pulse 78   Ht 5\' 7"  (1.702 m)   Wt 149 lb 3.2 oz (67.7 kg)   LMP 03/07/2011   SpO2 99%   BMI 23.37 kg/m   Physical Exam:  Well appearing middle-aged woman, NAD HEENT: Unremarkable Neck: 6 cm JVD, no thyromegally Lymphatics:  No adenopathy Back:  No CVA tenderness Lungs:  Clear, with no wheezes, rales, or rhonchi trace peripheral HEART:  Regular rate rhythm, no murmurs, no rubs, no clicks Abd:  soft, positive bowel sounds, no organomegally, no rebound, no guarding Ext:  2 plus pulses,  edema, no cyanosis, no clubbing Skin:  No rashes no nodules Neuro:  CN II through  XII intact, motor grossly intact  EKG -normal sinus rhythm with no ventricular preexcitation DEVICE  Normal device function.  See PaceArt for details.  SVT at 200 beats a minute  Assess/Plan:  1.  SVT -the risks, goals, benefits, and expectations of catheter ablation were reviewed in detail and she wishes to proceed. 2.  Syncope -if she can undergo successful ablation, I would release her to drive in 1 week.  Ewing Fandino,M.D.  I have over 45 minutes spent to produce this note including 50% face-to-face time.

## 2017-05-11 LAB — CBC WITH DIFFERENTIAL/PLATELET
BASOS ABS: 0.1 10*3/uL (ref 0.0–0.2)
Basos: 1 %
EOS (ABSOLUTE): 0.1 10*3/uL (ref 0.0–0.4)
Eos: 1 %
HEMATOCRIT: 42 % (ref 34.0–46.6)
Hemoglobin: 14.4 g/dL (ref 11.1–15.9)
Immature Grans (Abs): 0 10*3/uL (ref 0.0–0.1)
Immature Granulocytes: 0 %
LYMPHS ABS: 1.4 10*3/uL (ref 0.7–3.1)
Lymphs: 15 %
MCH: 31.3 pg (ref 26.6–33.0)
MCHC: 34.3 g/dL (ref 31.5–35.7)
MCV: 91 fL (ref 79–97)
MONOCYTES: 7 %
Monocytes Absolute: 0.7 10*3/uL (ref 0.1–0.9)
Neutrophils Absolute: 7.2 10*3/uL — ABNORMAL HIGH (ref 1.4–7.0)
Neutrophils: 76 %
Platelets: 267 10*3/uL (ref 150–379)
RBC: 4.6 x10E6/uL (ref 3.77–5.28)
RDW: 13 % (ref 12.3–15.4)
WBC: 9.4 10*3/uL (ref 3.4–10.8)

## 2017-05-11 LAB — BASIC METABOLIC PANEL
BUN / CREAT RATIO: 14 (ref 12–28)
BUN: 11 mg/dL (ref 8–27)
CO2: 26 mmol/L (ref 20–29)
Calcium: 9.9 mg/dL (ref 8.7–10.3)
Chloride: 99 mmol/L (ref 96–106)
Creatinine, Ser: 0.78 mg/dL (ref 0.57–1.00)
GFR, EST AFRICAN AMERICAN: 96 mL/min/{1.73_m2} (ref >59)
GFR, EST NON AFRICAN AMERICAN: 83 mL/min/{1.73_m2} (ref >59)
Glucose: 109 mg/dL — ABNORMAL HIGH (ref 65–99)
POTASSIUM: 4.1 mmol/L (ref 3.5–5.2)
SODIUM: 140 mmol/L (ref 134–144)

## 2017-05-11 NOTE — Telephone Encounter (Signed)
Patient scheduled for ablation 05/24/17.

## 2017-05-12 ENCOUNTER — Encounter: Payer: Self-pay | Admitting: Cardiology

## 2017-05-16 LAB — CUP PACEART REMOTE DEVICE CHECK
Date Time Interrogation Session: 20181201164044
Implantable Pulse Generator Implant Date: 20181101

## 2017-05-17 LAB — CUP PACEART INCLINIC DEVICE CHECK
Date Time Interrogation Session: 20181205134027
MDC IDC PG IMPLANT DT: 20181101
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
## 2017-05-18 ENCOUNTER — Other Ambulatory Visit: Payer: Self-pay | Admitting: Internal Medicine

## 2017-05-23 ENCOUNTER — Ambulatory Visit: Payer: PRIVATE HEALTH INSURANCE

## 2017-05-24 ENCOUNTER — Ambulatory Visit (HOSPITAL_COMMUNITY)
Admission: RE | Admit: 2017-05-24 | Discharge: 2017-05-24 | Disposition: A | Discharge status: Home / Self Care | Payer: PRIVATE HEALTH INSURANCE | Source: Ambulatory Visit | Attending: Internal Medicine | Admitting: Internal Medicine

## 2017-05-24 ENCOUNTER — Encounter (HOSPITAL_COMMUNITY)
Admission: RE | Disposition: A | Discharge status: Home / Self Care | Payer: Self-pay | Source: Ambulatory Visit | Attending: Internal Medicine

## 2017-05-24 DIAGNOSIS — I471 Supraventricular tachycardia: Secondary | ICD-10-CM | POA: Diagnosis not present

## 2017-05-24 DIAGNOSIS — Z79899 Other long term (current) drug therapy: Secondary | ICD-10-CM | POA: Insufficient documentation

## 2017-05-24 DIAGNOSIS — R55 Syncope and collapse: Secondary | ICD-10-CM | POA: Diagnosis present

## 2017-05-24 HISTORY — PX: SVT ABLATION: EP1225

## 2017-05-24 SURGERY — SVT ABLATION

## 2017-05-24 MED ORDER — ONDANSETRON HCL 4 MG/2ML IJ SOLN: 4.0000 mg | Freq: Four times a day (QID) | INTRAMUSCULAR | Status: DC | PRN

## 2017-05-24 MED ORDER — HEPARIN (PORCINE) IN NACL 2-0.9 UNIT/ML-% IJ SOLN
INTRAMUSCULAR | Status: AC
  Filled 2017-05-24: qty 500

## 2017-05-24 MED ORDER — FENTANYL CITRATE (PF) 100 MCG/2ML IJ SOLN
INTRAMUSCULAR | Status: AC
  Filled 2017-05-24: qty 2

## 2017-05-24 MED ORDER — SODIUM CHLORIDE 0.9 % IV SOLN
INTRAVENOUS | Status: DC
  Administered 2017-05-24: 08:00:00 via INTRAVENOUS

## 2017-05-24 MED ORDER — MIDAZOLAM HCL 5 MG/5ML IJ SOLN
INTRAMUSCULAR | Status: AC
  Filled 2017-05-24: qty 5

## 2017-05-24 MED ORDER — SODIUM CHLORIDE 0.9 % IV SOLN: 250.0000 mL | INTRAVENOUS | Status: DC | PRN

## 2017-05-24 MED ORDER — BUPIVACAINE HCL (PF) 0.25 % IJ SOLN
INTRAMUSCULAR | Status: AC
  Filled 2017-05-24: qty 60

## 2017-05-24 MED ORDER — BUPIVACAINE HCL (PF) 0.25 % IJ SOLN
INTRAMUSCULAR | Status: DC | PRN
  Administered 2017-05-24: 60 mL

## 2017-05-24 MED ORDER — SODIUM CHLORIDE 0.9% FLUSH
3.0000 mL | INTRAVENOUS | Status: DC | PRN
Start: 2017-05-24 — End: 2017-05-24

## 2017-05-24 MED ORDER — SODIUM CHLORIDE 0.9 % IV SOLN
INTRAVENOUS | Status: DC | PRN
  Administered 2017-05-24: 4 ug/min via INTRAVENOUS

## 2017-05-24 MED ORDER — ACETAMINOPHEN 325 MG PO TABS
650.0000 mg | ORAL_TABLET | ORAL | Status: DC | PRN
  Filled 2017-05-24: qty 2

## 2017-05-24 MED ORDER — ISOPROTERENOL HCL 0.2 MG/ML IJ SOLN
INTRAMUSCULAR | Status: AC
  Filled 2017-05-24: qty 5

## 2017-05-24 MED ORDER — FENTANYL CITRATE (PF) 100 MCG/2ML IJ SOLN
INTRAMUSCULAR | Status: DC | PRN
  Administered 2017-05-24 (×2): 25 ug via INTRAVENOUS
  Administered 2017-05-24: 12.5 ug via INTRAVENOUS
  Administered 2017-05-24 (×2): 25 ug via INTRAVENOUS

## 2017-05-24 MED ORDER — SODIUM CHLORIDE 0.9% FLUSH: 3.0000 mL | Freq: Two times a day (BID) | INTRAVENOUS | Status: DC

## 2017-05-24 MED ORDER — MIDAZOLAM HCL 5 MG/5ML IJ SOLN
INTRAMUSCULAR | Status: DC | PRN
  Administered 2017-05-24: 2 mg via INTRAVENOUS
  Administered 2017-05-24 (×3): 1 mg via INTRAVENOUS
  Administered 2017-05-24 (×2): 2 mg via INTRAVENOUS

## 2017-05-24 SURGICAL SUPPLY — 11 items
BAG SNAP BAND KOVER 36X36 (MISCELLANEOUS) ×2 IMPLANT
CATH CELSIUS THERM D CV 7F (ABLATOR) ×2 IMPLANT
CATH HEX JOS 2-5-2 65CM 6F REP (CATHETERS) ×2 IMPLANT
CATH JOSEPHSON QUAD-ALLRED 6FR (CATHETERS) ×4 IMPLANT
PACK EP LATEX FREE (CUSTOM PROCEDURE TRAY) ×3
PACK EP LF (CUSTOM PROCEDURE TRAY) IMPLANT
PAD DEFIB LIFELINK (PAD) ×2 IMPLANT
SHEATH PINNACLE 6F 10CM (SHEATH) ×4 IMPLANT
SHEATH PINNACLE 7F 10CM (SHEATH) ×2 IMPLANT
SHEATH PINNACLE 8F 10CM (SHEATH) ×2 IMPLANT
SHIELD RADPAD SCOOP 12X17 (MISCELLANEOUS) ×2 IMPLANT

## 2017-05-24 NOTE — Progress Notes (Signed)
Site area: rt groin fv sheaths x3 Site Prior to Removal:  Level 0 Pressure Applied For:  15 minutes Manual:   yes Patient Status During Pull:  stable Post Pull Site:  Level  0 Post Pull Instructions Given:  yes Post Pull Pulses Present: palpable rt  dp Dressing Applied:  Gauze and tegaderm Bedrest begins @ 1050 Comments:  IV saline locked

## 2017-05-24 NOTE — Progress Notes (Signed)
Patient is seen post procedure SR 70's, 128/77 She feels well, no site pain, CP, or SOB R IJ and R groin sites are stable, no bleeding, hematoma, or tenderness Site care and activity restrictions were discussed with the patient Routine post procedure follow up has been arranged. I anticipate discharge after her bedrest is completed, after ambulation if procedure sites and patient remain stable  Tommye Standard, PA-C  EP Attending  Patient seen and examined. Agree with above.   Mikle Bosworth.D.

## 2017-05-24 NOTE — Progress Notes (Signed)
Site area: rt IJ venous sheath Site Prior to Removal:  Level 0 Pressure Applied For: 10 minutes Manual:   yes Patient Status During Pull:  stable Post Pull Site:  Level 0 Post Pull Instructions Given:  yes Post Pull Pulses Present: na Dressing Applied:  Gauze and tegaderm Bedrest begins @  Comments:   

## 2017-05-24 NOTE — Interval H&P Note (Signed)
History and Physical Interval Note:  05/24/2017 8:02 AM  Kimberly Wheeler  has presented today for surgery, with the diagnosis of svt  The various methods of treatment have been discussed with the patient and family. After consideration of risks, benefits and other options for treatment, the patient has consented to  Procedure(s): SVT ABLATION (N/A) as a surgical intervention .  The patient's history has been reviewed, patient examined, no change in status, stable for surgery.  I have reviewed the patient's chart and labs.  Questions were answered to the patient's satisfaction.     [name]

## 2017-05-24 NOTE — Discharge Instructions (Signed)
No driving until cleared to by Dr. Lovena Le. No lifting over 5 lbs for 1 week. No vigorous or sexual activity for 1 week. You may return to work in one week. Keep procedure site clean & dry. If you notice increased pain, swelling, bleeding or pus, call/return!  You may shower, but no soaking baths/hot tubs/pools for 1 week.    POST ABLATION , Care After This sheet gives you information about how to care for yourself after your procedure. Your health care provider may also give you more specific instructions. If you have problems or questions, contact your health care provider. What can I expect after the procedure? After the procedure, it is common to have bruising and tenderness at the catheter insertion area. Follow these instructions at home: Insertion site care  Follow instructions from your health care provider about how to take care of your insertion site. Make sure you: ? Wash your hands with soap and water before you change your bandage (dressing). If soap and water are not available, use hand sanitizer. ? Change your dressing as told by your health care provider. ? Leave stitches (sutures), skin glue, or adhesive strips in place. These skin closures may need to stay in place for 2 weeks or longer. If adhesive strip edges start to loosen and curl up, you may trim the loose edges. Do not remove adhesive strips completely unless your health care provider tells you to do that.  Do not take baths, swim, or use a hot tub until your health care provider approves.  You may shower 24-48 hours after the procedure or as told by your health care provider. ? Gently wash the site with plain soap and water. ? Pat the area dry with a clean towel. ? Do not rub the site. This may cause bleeding.  Do not apply powder or lotion to the site. Keep the site clean and dry.  Check your insertion site every day for signs of infection. Check for: ? Redness, swelling, or pain. ? Fluid or  blood. ? Warmth. ? Pus or a bad smell. Activity  Rest as told by your health care provider, usually for 1-2 days.  Do not lift anything that is heavier than 10 lbs. (4.5 kg) or as told by your health care provider.  Do not drive for 24 hours if you were given a medicine to help you relax (sedative).  Do not drive or use heavy machinery while taking prescription pain medicine. General instructions  Return to your normal activities as told by your health care provider, usually in about a week. Ask your health care provider what activities are safe for you.  If the catheter site starts bleeding, lie flat and put pressure on the site. If the bleeding does not stop, get help right away. This is a medical emergency.  Drink enough fluid to keep your urine clear or pale yellow. This helps flush the contrast dye from your body.  Take over-the-counter and prescription medicines only as told by your health care provider.  Keep all follow-up visits as told by your health care provider. This is important. Contact a health care provider if:  You have a fever or chills.  You have redness, swelling, or pain around your insertion site.  You have fluid or blood coming from your insertion site.  The insertion site feels warm to the touch.  You have pus or a bad smell coming from your insertion site.  You have bruising around the insertion site.  You notice blood collecting in the tissue around the catheter site (hematoma). The hematoma may be painful to the touch. Get help right away if:  You have severe pain at the catheter insertion area.  The catheter insertion area swells very fast.  The catheter insertion area is bleeding, and the bleeding does not stop when you hold steady pressure on the area.  The area near or just beyond the catheter insertion site becomes pale, cool, tingly, or numb. These symptoms may represent a serious problem that is an emergency. Do not wait to see if the  symptoms will go away. Get medical help right away. Call your local emergency services (911 in the [place] Do not drive yourself to the hospital. Summary  After the procedure, it is common to have bruising and tenderness at the catheter insertion area.  After the procedure, it is important to rest and drink plenty of fluids.  Do not take baths, swim, or use a hot tub until your health care provider says it is okay to do so. You may shower 24-48 hours after the procedure or as told by your health care provider.  If the catheter site starts bleeding, lie flat and put pressure on the site. If the bleeding does not stop, get help right away. This is a medical emergency. This information is not intended to replace advice given to you by your health care provider. Make sure you discuss any questions you have with your health care provider. Document Released: 12/09/2004 Document Revised: 04/27/2016 Document Reviewed: 04/27/2016 Elsevier Interactive Patient Education  [employer].

## 2017-05-25 ENCOUNTER — Encounter (HOSPITAL_COMMUNITY): Payer: Self-pay | Admitting: Internal Medicine

## 2017-05-25 MED FILL — Heparin Sodium (Porcine) 2 Unit/ML in Sodium Chloride 0.9%: INTRAMUSCULAR | Qty: 1000 | Status: AC

## 2017-05-25 MED FILL — Heparin Sodium (Porcine) 2 Unit/ML in Sodium Chloride 0.9%: INTRAMUSCULAR | Qty: 500 | Status: AC

## 2017-05-31 ENCOUNTER — Ambulatory Visit: Payer: PRIVATE HEALTH INSURANCE | Admitting: Cardiology

## 2017-05-31 ENCOUNTER — Telehealth: Payer: Self-pay

## 2017-05-31 DIAGNOSIS — N390 Urinary tract infection, site not specified: Secondary | ICD-10-CM | POA: Insufficient documentation

## 2017-05-31 DIAGNOSIS — N329 Bladder disorder, unspecified: Secondary | ICD-10-CM | POA: Insufficient documentation

## 2017-05-31 DIAGNOSIS — D219 Benign neoplasm of connective and other soft tissue, unspecified: Secondary | ICD-10-CM | POA: Insufficient documentation

## 2017-05-31 DIAGNOSIS — M1990 Unspecified osteoarthritis, unspecified site: Secondary | ICD-10-CM | POA: Insufficient documentation

## 2017-05-31 NOTE — Telephone Encounter (Signed)
Received DMV documentation request.  Documentation completed and left at front desk for pick up.  Left VM notifying patient.

## 2017-06-05 ENCOUNTER — Ambulatory Visit (INDEPENDENT_AMBULATORY_CARE_PROVIDER_SITE_OTHER): Payer: PRIVATE HEALTH INSURANCE | Admitting: *Deleted

## 2017-06-05 DIAGNOSIS — R55 Syncope and collapse: Secondary | ICD-10-CM | POA: Diagnosis not present

## 2017-06-05 NOTE — Progress Notes (Signed)
Carelink Summary Report / Loop Recorder 

## 2017-06-09 ENCOUNTER — Telehealth: Payer: PRIVATE HEALTH INSURANCE | Admitting: Family

## 2017-06-09 DIAGNOSIS — B9689 Other specified bacterial agents as the cause of diseases classified elsewhere: Secondary | ICD-10-CM

## 2017-06-09 DIAGNOSIS — J329 Chronic sinusitis, unspecified: Secondary | ICD-10-CM

## 2017-06-09 MED ORDER — BENZONATATE 100 MG PO CAPS: 100.0000 mg | ORAL_CAPSULE | Freq: Three times a day (TID) | ORAL | 0 refills | Status: DC | PRN

## 2017-06-09 MED ORDER — AMOXICILLIN-POT CLAVULANATE 875-125 MG PO TABS: 1.0000 | ORAL_TABLET | Freq: Two times a day (BID) | ORAL | 0 refills | Status: AC

## 2017-06-09 NOTE — Progress Notes (Signed)
Thank you for the details you included in the comment boxes. Those details are very helpful in determining the best course of treatment for you and help [place] to provide the best care.  We are sorry that you are not feeling well.  Here is how we plan to help!   Based on what you have shared with me it looks like you have sinusitis.  Sinusitis is inflammation and infection in the sinus cavities of the head.  Based on your presentation I believe you most likely have Acute Bacterial Sinusitis.  This is an infection caused by bacteria and is treated with antibiotics. I have prescribed Augmentin 875mg /125mg  one tablet twice daily with food, for 7 days. You may use an oral decongestant such as Mucinex D or if you have glaucoma or high blood pressure use plain Mucinex. Saline nasal spray help and can safely be used as often as needed for congestion.  If you develop worsening sinus pain, fever or notice severe headache and vision changes, or if symptoms are not better after completion of antibiotic, please schedule an appointment with a health care provider.  I have also sent Tessalon Perles 100mg , take 1 or 2 every 8 hours as needed for cough.   Sinus infections are not as easily transmitted as other respiratory infection, however we still recommend that you avoid close contact with loved ones, especially the very young and elderly.  Remember to wash your hands thoroughly throughout the day as this is the number one way to prevent the spread of infection!  Home Care:  Only take medications as instructed by your medical team.  Complete the entire course of an antibiotic.  Do not take these medications with alcohol.  A steam or ultrasonic humidifier can help congestion.  You can place a towel over your head and breathe in the steam from hot water coming from a faucet.  Avoid close contacts especially the very young and the elderly.  Cover your mouth when you cough or sneeze.  Always remember to wash your  hands.  Get Help Right Away If:  You develop worsening fever or sinus pain.  You develop a severe head ache or visual changes.  Your symptoms persist after you have completed your treatment plan.  Make sure you  Understand these instructions.  Will watch your condition.  Will get help right away if you are not doing well or get worse.  Your e-visit answers were reviewed by a board certified advanced clinical practitioner to complete your personal care plan.  Depending on the condition, your plan could have included both over the counter or prescription medications.  If there is a problem please reply  once you have received a response from your provider.  Your safety is important to [place].  If you have drug allergies check your prescription carefully.    You can use MyChart to ask questions about today's visit, request a non-urgent call back, or ask for a work or school excuse for 24 hours related to this e-Visit. If it has been greater than 24 hours you will need to follow up with your provider, or enter a new e-Visit to address those concerns.  You will get an e-mail in the next two days asking about your experience.  I hope that your e-visit has been valuable and will speed your recovery. Thank you for using e-visits.

## 2017-06-15 LAB — CUP PACEART REMOTE DEVICE CHECK
Date Time Interrogation Session: 20181231163816
MDC IDC PG IMPLANT DT: 20181101

## 2017-06-20 ENCOUNTER — Encounter: Payer: PRIVATE HEALTH INSURANCE | Admitting: Internal Medicine

## 2017-06-21 ENCOUNTER — Ambulatory Visit (INDEPENDENT_AMBULATORY_CARE_PROVIDER_SITE_OTHER): Payer: PRIVATE HEALTH INSURANCE | Admitting: Physician Assistant

## 2017-06-21 VITALS — BP 108/64 | HR 74 | Ht 67.0 in | Wt 153.0 lb

## 2017-06-21 DIAGNOSIS — I471 Supraventricular tachycardia: Secondary | ICD-10-CM

## 2017-06-21 NOTE — Progress Notes (Signed)
Cardiology Office Note Date:  06/21/2017  Patient ID:  Kimberly Wheeler, Kimberly Wheeler 09-Jul-1955, MRN [id number] PCP:  Laurey Morale, MD  Electrophysiologist:  Dr. Lovena Le    Chief Complaint: post ablation visit  History of Present Illness: Kimberly Wheeler is a 62 y.o. female with history of palpitations undiagnosed for some time had ILR when noted to have SVT, she underwent ablation with Dr. Lovena Le 05/24/17.  She comes in today to be seen for Dr. Lovena Le, post-procedure.  She feels very well.  Post procedure she has had no symptoms, her procedure sites healed without complication.  She has not had any palpitations, no dizziness, near syncope or syncope.  No CP or SOB.   Device information: MDT ILR, implanted 04/06/17, palpitations  Past Medical History:  Diagnosis Date  . Arthritis    fingers  . Chronic UTI (urinary tract infection)   . Fibroid   . Injury of fifth finger of right hand   . Lesion of bladder   . SVT (supraventricular tachycardia) (HCC)     Past Surgical History:  Procedure Laterality Date  . CESAREAN SECTION  1986  . CESAREAN SECTION W/BTL  1988  . CYSTOSCOPY W/ RETROGRADES  06/15/2011   Procedure: CYSTOSCOPY WITH RETROGRADE PYELOGRAM;  Surgeon: Molli Hazard, MD;  [facility];  Service: Urology;  Laterality: Bilateral;  (BIL) RPG  . CYSTOSCOPY WITH BIOPSY  06/15/2011   Procedure: CYSTOSCOPY WITH BIOPSY;  Surgeon: Molli Hazard, MD;  Location: [facility];  Service: Urology;  Laterality: N/A;  BLADDER BIOPSY C-ARM CAMERA    . LOOP RECORDER INSERTION N/A 04/06/2017   Procedure: LOOP RECORDER INSERTION;  Surgeon: Evans Lance, MD;  Location: [place] [facility];  Service: Cardiovascular;  Laterality: N/A;  . SALPINGOOPHORECTOMY  1991   LEFT  . SVT ABLATION N/A 05/24/2017   Procedure: SVT ABLATION;  Surgeon: Evans Lance, MD;  Location: [facility];  Service: Cardiovascular;  Laterality: N/A;    Current  Outpatient Medications  Medication Sig Dispense Refill  . acetaminophen (TYLENOL) 500 MG tablet Take 1,000 mg by mouth every 6 (six) hours as needed for mild pain.    . benzonatate (TESSALON PERLES) 100 MG capsule Take 1-2 capsules (100-200 mg total) by mouth every 8 (eight) hours as needed for cough. 30 capsule 0  . CALCIUM PO Take 1 tablet by mouth daily.    . magnesium 30 MG tablet Take 30 mg by mouth daily.     No current facility-administered medications for this visit.     Allergies:   Darvon and Macrodantin [nitrofurantoin]   Social History:  The patient  reports that  has never smoked. she has never used smokeless tobacco. She reports that she drinks alcohol. She reports that she does not use drugs.   Family History:  The patient's family history includes Breast cancer in her maternal aunt; Diabetes in her maternal grandmother; Heart attack in her maternal grandmother; Osteoporosis in her mother; Prostate cancer in her father.  ROS:  Please see the history of present illness.  All other systems are reviewed and otherwise negative.   PHYSICAL EXAM:  VS:  BP 108/64   Pulse 74   Ht 5\' 7"  (1.702 m)   Wt 153 lb (69.4 kg)   LMP 03/07/2011   BMI 23.96 kg/m  BMI: Body mass index is 23.96 kg/m. Well nourished, well developed, in no acute distress  HEENT: normocephalic, atraumatic  Neck: no JVD,  carotid bruits or masses Cardiac:  RRR; no significant murmurs, no rubs, or gallops Lungs:   CTA b/l, no wheezing, rhonchi or rales  Abd: soft, nontender MS: no deformity or atrophy Ext: no edema  Skin: warm and dry, no rash Neuro:  No gross deficits appreciated Psych: euthymic mood, full affect  procedure sites, R groin, R IJ are healed well, nontender, no hematoma  ILR site is stable, no tethering or discomfort   EKG:  Not done today ILR interrogation done today and reviewed by myself: battery status is good, no SVT since her procedure date  05/24/17: EPS/Ablation CONCLUSIONS:   1. Sinus rhythm upon presentation.  2. The patient had dual AV nodal physiology and double echo beats with a h/o SVT but there were no other accessory pathways or arrhythmias induced  3. Successful radiofrequency ablation of the slow AV nodal pathway  4. No inducible arrhythmias following ablation.  5. No early apparent complications.   02/28/17: TTE Study Conclusions - Left ventricle: The cavity size was normal. Wall thickness was   normal. Systolic function was normal. The estimated ejection   fraction was in the range of 60% to 65%. Wall motion was normal;   there were no regional wall motion abnormalities. Doppler   parameters are consistent with abnormal left ventricular   relaxation (grade 1 diastolic dysfunction). - Mitral valve: Mild prolapse, involving the anterior leaflet.  Recent Labs: 05/10/2017: BUN 11; Creatinine, Ser 0.78; Hemoglobin 14.4; Platelets 267; Potassium 4.1; Sodium 140  No results found for requested labs within last 8760 hours.   CrCl cannot be calculated (Patient's most recent lab result is older than the maximum 21 days allowed.).   Wt Readings from Last 3 Encounters:  06/21/17 153 lb (69.4 kg)  05/24/17 145 lb (65.8 kg)  05/10/17 149 lb 3.2 oz (67.7 kg)     Other studies reviewed: Additional studies/records reviewed today include: summarized above  ASSESSMENT AND PLAN:  1. SVT     S/p ablation Dec 2018     Feels great post procedure  2. ILR     We discussed monitoring going forward.   She prefers to un enroll she will unplug her home transmitter and we will discontinue monitoring.  Should she have any symptoms she will notify us, come in and we can resume monitoring.   Disposition: F/u PRN  Current medicines are reviewed at length with the patient today.  The patient did not have any concerns regarding medicines.  Venetia Night, PA-C 06/21/2017 4:23 PM     [place]  [id number] [phone] (office)  [phone] (fax)

## 2017-06-21 NOTE — Patient Instructions (Signed)

## 2017-06-30 ENCOUNTER — Telehealth: Payer: PRIVATE HEALTH INSURANCE | Admitting: Family

## 2017-06-30 DIAGNOSIS — J329 Chronic sinusitis, unspecified: Secondary | ICD-10-CM

## 2017-06-30 DIAGNOSIS — B9689 Other specified bacterial agents as the cause of diseases classified elsewhere: Secondary | ICD-10-CM

## 2017-06-30 MED ORDER — DOXYCYCLINE HYCLATE 100 MG PO TABS: 100.0000 mg | ORAL_TABLET | Freq: Two times a day (BID) | ORAL | 0 refills | Status: DC

## 2017-06-30 NOTE — Progress Notes (Signed)
Thank you for the details you included in the comment boxes. Those details are very helpful in determining the best course of treatment for you and help us to provide the best care.  We are sorry that you are not feeling well.  Here is how we plan to help!  Based on what you have shared with me it looks like you have sinusitis.  Sinusitis is inflammation and infection in the sinus cavities of the head.  Based on your presentation I believe you most likely have Acute Bacterial Sinusitis.  This is an infection caused by bacteria and is treated with antibiotics. I have prescribed Doxycycline 100mg by mouth twice a day for 10 days. You may use an oral decongestant such as Mucinex D or if you have glaucoma or high blood pressure use plain Mucinex. Saline nasal spray help and can safely be used as often as needed for congestion.  If you develop worsening sinus pain, fever or notice severe headache and vision changes, or if symptoms are not better after completion of antibiotic, please schedule an appointment with a health care provider.    Sinus infections are not as easily transmitted as other respiratory infection, however we still recommend that you avoid close contact with loved ones, especially the very young and elderly.  Remember to wash your hands thoroughly throughout the day as this is the number one way to prevent the spread of infection!  Home Care:  Only take medications as instructed by your medical team.  Complete the entire course of an antibiotic.  Do not take these medications with alcohol.  A steam or ultrasonic humidifier can help congestion.  You can place a towel over your head and breathe in the steam from hot water coming from a faucet.  Avoid close contacts especially the very young and the elderly.  Cover your mouth when you cough or sneeze.  Always remember to wash your hands.  Get Help Right Away If:  You develop worsening fever or sinus pain.  You develop a severe  head ache or visual changes.  Your symptoms persist after you have completed your treatment plan.  Make sure you  Understand these instructions.  Will watch your condition.  Will get help right away if you are not doing well or get worse.  Your e-visit answers were reviewed by a board certified advanced clinical practitioner to complete your personal care plan.  Depending on the condition, your plan could have included both over the counter or prescription medications.  If there is a problem please reply  once you have received a response from your provider.  Your safety is important to us.  If you have drug allergies check your prescription carefully.    You can use MyChart to ask questions about today's visit, request a non-urgent call back, or ask for a work or school excuse for 24 hours related to this e-Visit. If it has been greater than 24 hours you will need to follow up with your provider, or enter a new e-Visit to address those concerns.  You will get an e-mail in the next two days asking about your experience.  I hope that your e-visit has been valuable and will speed your recovery. Thank you for using e-visits.     

## 2017-07-05 ENCOUNTER — Encounter: Payer: PRIVATE HEALTH INSURANCE | Admitting: *Deleted

## 2017-07-18 ENCOUNTER — Encounter: Payer: PRIVATE HEALTH INSURANCE | Admitting: Internal Medicine

## 2017-08-09 ENCOUNTER — Encounter: Payer: Self-pay | Admitting: Certified Nurse Midwife

## 2017-08-09 ENCOUNTER — Other Ambulatory Visit: Payer: Self-pay

## 2017-08-09 ENCOUNTER — Other Ambulatory Visit: Payer: Self-pay | Admitting: Certified Nurse Midwife

## 2017-08-09 ENCOUNTER — Ambulatory Visit (INDEPENDENT_AMBULATORY_CARE_PROVIDER_SITE_OTHER): Payer: PRIVATE HEALTH INSURANCE | Admitting: Certified Nurse Midwife

## 2017-08-09 VITALS — BP 114/62 | HR 68 | Resp 16 | Ht 65.5 in | Wt 151.0 lb

## 2017-08-09 DIAGNOSIS — Z1211 Encounter for screening for malignant neoplasm of colon: Secondary | ICD-10-CM

## 2017-08-09 DIAGNOSIS — N951 Menopausal and female climacteric states: Secondary | ICD-10-CM | POA: Diagnosis not present

## 2017-08-09 DIAGNOSIS — Z01419 Encounter for gynecological examination (general) (routine) without abnormal findings: Secondary | ICD-10-CM

## 2017-08-09 DIAGNOSIS — N6310 Unspecified lump in the right breast, unspecified quadrant: Secondary | ICD-10-CM | POA: Diagnosis not present
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
## 2017-08-09 NOTE — Progress Notes (Signed)
Scheduled patient for Rt.Diag.MMG with U/S and Lt.screening MMG for 08-28-08 9:30am at [facility]. Patient was offered sooner appointment but her yearly MMG not due until after 08-26-17 and patient wanted to wait and do all at one time.

## 2017-08-22 ENCOUNTER — Telehealth: Payer: Self-pay | Admitting: Certified Nurse Midwife

## 2017-08-22 NOTE — Telephone Encounter (Signed)
Left voicemail regarding referral appointment. The information is listed below. Should the patient need to cancel or reschedule this appointment, Please advise them to call the office they've been referred to in order to reschedule.  [facility] [address] [place], [place]  Phone: [phone]  Dr. Collene Mares 09/14/17 @ 10:00 am. Please arrive 15 minutes early and bring your insurance card and photo id and list of medications.

## 2017-08-28 ENCOUNTER — Ambulatory Visit
Admission: RE | Admit: 2017-08-28 | Discharge: 2017-08-28 | Disposition: A | Discharge status: Home / Self Care | Payer: PRIVATE HEALTH INSURANCE | Source: Ambulatory Visit | Attending: Certified Nurse Midwife | Admitting: Certified Nurse Midwife

## 2017-08-28 DIAGNOSIS — N6310 Unspecified lump in the right breast, unspecified quadrant: Secondary | ICD-10-CM

## 2017-08-29 ENCOUNTER — Telehealth: Payer: PRIVATE HEALTH INSURANCE | Admitting: Family

## 2017-08-29 DIAGNOSIS — R300 Dysuria: Secondary | ICD-10-CM

## 2017-08-29 MED ORDER — CIPROFLOXACIN HCL 500 MG PO TABS: 500.0000 mg | ORAL_TABLET | Freq: Two times a day (BID) | ORAL | 0 refills | Status: DC

## 2017-08-29 NOTE — Progress Notes (Signed)

## 2017-11-20 ENCOUNTER — Encounter: Payer: Self-pay | Admitting: Family Medicine

## 2017-11-20 ENCOUNTER — Ambulatory Visit (INDEPENDENT_AMBULATORY_CARE_PROVIDER_SITE_OTHER): Payer: PRIVATE HEALTH INSURANCE | Admitting: Family Medicine

## 2017-11-20 VITALS — BP 130/68 | HR 99 | Temp 98.4°F | Ht 65.5 in | Wt 146.2 lb

## 2017-11-20 DIAGNOSIS — Z0000 Encounter for general adult medical examination without abnormal findings: Secondary | ICD-10-CM | POA: Diagnosis not present

## 2017-11-20 MED ORDER — LORAZEPAM 0.5 MG PO TABS: 0.5000 mg | ORAL_TABLET | Freq: Four times a day (QID) | ORAL | 2 refills | Status: DC | PRN

## 2017-11-20 NOTE — Progress Notes (Signed)
   Subjective:    Patient ID: Kimberly Wheeler, female    DOB: [birth year], 62 y.o.   MRN: [id number]  HPI Here for a well exam. Her only complaint today is about anxiety. Her job is very stressful and her marriage causes a lot of stress. She tries to relax when she can but this is difficult. She works out with a Physiological scientist 2-3 days a week. She sleeps well and her appetite is good.    Review of Systems  Constitutional: Negative.   HENT: Negative.   Eyes: Negative.   Respiratory: Negative.   Cardiovascular: Negative.   Gastrointestinal: Negative.   Genitourinary: Negative for decreased urine volume, difficulty urinating, dyspareunia, dysuria, enuresis, flank pain, frequency, hematuria, pelvic pain and urgency.  Musculoskeletal: Negative.   Skin: Negative.   Neurological: Negative.   Psychiatric/Behavioral: Negative for agitation, behavioral problems, confusion, decreased concentration, dysphoric mood, hallucinations and sleep disturbance. The patient is nervous/anxious.        Objective:   Physical Exam  Constitutional: She is oriented to person, place, and time. She appears well-developed and well-nourished. No distress.  HENT:  Head: Normocephalic and atraumatic.  Right Ear: External ear normal.  Left Ear: External ear normal.  Nose: Nose normal.  Mouth/Throat: Oropharynx is clear and moist. No oropharyngeal exudate.  Eyes: Pupils are equal, round, and reactive to light. Conjunctivae and EOM are normal. No scleral icterus.  Neck: Normal range of motion. Neck supple. No JVD present. No thyromegaly present.  Cardiovascular: Normal rate, regular rhythm, normal heart sounds and intact distal pulses. Exam reveals no gallop and no friction rub.  No murmur heard. Pulmonary/Chest: Effort normal and breath sounds normal. No respiratory distress. She has no wheezes. She has no rales. She exhibits no tenderness.  Abdominal: Soft. Bowel sounds are normal. She exhibits no distension and no  mass. There is no tenderness. There is no rebound and no guarding.  Musculoskeletal: Normal range of motion. She exhibits no edema or tenderness.  Lymphadenopathy:    She has no cervical adenopathy.  Neurological: She is alert and oriented to person, place, and time. She has normal reflexes. She displays normal reflexes. No cranial nerve deficit. She exhibits normal muscle tone. Coordination normal.  Skin: Skin is warm and dry. No rash noted. No erythema.  Psychiatric: She has a normal mood and affect. Her behavior is normal. Judgment and thought content normal.          Assessment & Plan:  Well exam. We discussed diet and exercise. Set up fasting labs soon. For anxiety she wil try Lorazepam 0.5 mg as needed.  Alysia Penna, MD

## 2017-12-14 ENCOUNTER — Other Ambulatory Visit (INDEPENDENT_AMBULATORY_CARE_PROVIDER_SITE_OTHER): Payer: PRIVATE HEALTH INSURANCE

## 2017-12-14 DIAGNOSIS — Z0000 Encounter for general adult medical examination without abnormal findings: Secondary | ICD-10-CM

## 2017-12-14 LAB — POC URINALSYSI DIPSTICK (AUTOMATED)
Bilirubin, UA: NEGATIVE
Blood, UA: NEGATIVE
GLUCOSE UA: NEGATIVE
Ketones, UA: NEGATIVE
LEUKOCYTES UA: NEGATIVE
Nitrite, UA: NEGATIVE
Protein, UA: NEGATIVE
Spec Grav, UA: 1.015 (ref 1.010–1.025)
UROBILINOGEN UA: 0.2 U/dL
pH, UA: 6 (ref 5.0–8.0)

## 2017-12-14 LAB — HEPATIC FUNCTION PANEL
ALBUMIN: 4.5 g/dL (ref 3.5–5.2)
ALK PHOS: 39 U/L (ref 39–117)
ALT: 17 U/L (ref 0–35)
AST: 21 U/L (ref 0–37)
BILIRUBIN DIRECT: 0.1 mg/dL (ref 0.0–0.3)
BILIRUBIN TOTAL: 0.6 mg/dL (ref 0.2–1.2)
Total Protein: 7.2 g/dL (ref 6.0–8.3)

## 2017-12-14 LAB — CBC WITH DIFFERENTIAL/PLATELET
Basophils Absolute: 0.1 10*3/uL (ref 0.0–0.1)
Basophils Relative: 0.9 % (ref 0.0–3.0)
EOS ABS: 0 10*3/uL (ref 0.0–0.7)
Eosinophils Relative: 0.9 % (ref 0.0–5.0)
HEMATOCRIT: 42.5 % (ref 36.0–46.0)
HEMOGLOBIN: 14.6 g/dL (ref 12.0–15.0)
Lymphocytes Relative: 17.4 % (ref 12.0–46.0)
Lymphs Abs: 1 10*3/uL (ref 0.7–4.0)
MCHC: 34.3 g/dL (ref 30.0–36.0)
MCV: 92.9 fl (ref 78.0–100.0)
Monocytes Absolute: 0.3 10*3/uL (ref 0.1–1.0)
Monocytes Relative: 6 % (ref 3.0–12.0)
Neutro Abs: 4.2 10*3/uL (ref 1.4–7.7)
Neutrophils Relative %: 74.8 % (ref 43.0–77.0)
Platelets: 230 10*3/uL (ref 150.0–400.0)
RBC: 4.57 Mil/uL (ref 3.87–5.11)
RDW: 13 % (ref 11.5–15.5)
WBC: 5.6 10*3/uL (ref 4.0–10.5)

## 2017-12-14 LAB — BASIC METABOLIC PANEL
BUN: 11 mg/dL (ref 6–23)
CO2: 29 meq/L (ref 19–32)
CREATININE: 0.82 mg/dL (ref 0.40–1.20)
Calcium: 9.6 mg/dL (ref 8.4–10.5)
Chloride: 102 mEq/L (ref 96–112)
GFR: 75.38 mL/min (ref >60.00)
GLUCOSE: 104 mg/dL — AB (ref 70–99)
Potassium: 4.4 mEq/L (ref 3.5–5.1)
Sodium: 139 mEq/L (ref 135–145)

## 2017-12-14 LAB — LIPID PANEL
CHOL/HDL RATIO: 2
Cholesterol: 242 mg/dL — ABNORMAL HIGH (ref 0–200)
HDL: 100.2 mg/dL (ref >39.00)
LDL CALC: 127 mg/dL — AB (ref 0–99)
NONHDL: 141.79
TRIGLYCERIDES: 75 mg/dL (ref 0.0–149.0)
VLDL: 15 mg/dL (ref 0.0–40.0)

## 2017-12-14 LAB — TSH: TSH: 2.13 u[IU]/mL (ref 0.35–4.50)

## 2018-01-22 HISTORY — PX: COLONOSCOPY: SHX174

## 2018-02-03 ENCOUNTER — Telehealth: Payer: PRIVATE HEALTH INSURANCE | Admitting: Family

## 2018-02-03 DIAGNOSIS — N390 Urinary tract infection, site not specified: Secondary | ICD-10-CM

## 2018-02-03 MED ORDER — CIPROFLOXACIN HCL 500 MG PO TABS: 500.0000 mg | ORAL_TABLET | Freq: Two times a day (BID) | ORAL | 0 refills | Status: DC

## 2018-02-03 NOTE — Progress Notes (Signed)
Thank you for the details you included in the comment boxes. Those details are very helpful in determining the best course of treatment for you and help [place] to provide the best care. Typically we use Keflex first, but you may be colonized with a bacteria sensitive to Cipro so we can do that. See below.  We are sorry that you are not feeling well.  Here is how we plan to help!  Based on what you shared with me it looks like you most likely have a simple urinary tract infection.  A UTI (Urinary Tract Infection) is a bacterial infection of the bladder.  Most cases of urinary tract infections are simple to treat but a key part of your care is to encourage you to drink plenty of fluids and watch your symptoms carefully.  I have prescribed Ciprofloxacin 500 mg twice a day for 5 days.  Your symptoms should gradually improve. Call us if the burning in your urine worsens, you develop worsening fever, back pain or pelvic pain or if your symptoms do not resolve after completing the antibiotic.  Urinary tract infections can be prevented by drinking plenty of water to keep your body hydrated.  Also be sure when you wipe, wipe from front to back and don't hold it in!  If possible, empty your bladder every 4 hours.  Your e-visit answers were reviewed by a board certified advanced clinical practitioner to complete your personal care plan.  Depending on the condition, your plan could have included both over the counter or prescription medications.  If there is a problem please reply  once you have received a response from your provider.  Your safety is important to [place].  If you have drug allergies check your prescription carefully.    You can use MyChart to ask questions about today's visit, request a non-urgent call back, or ask for a work or school excuse for 24 hours related to this e-Visit. If it has been greater than 24 hours you will need to follow up with your provider, or enter a new e-Visit to address  those concerns.   You will get an e-mail in the next two days asking about your experience.  I hope that your e-visit has been valuable and will speed your recovery. Thank you for using e-visits.

## 2018-04-03 ENCOUNTER — Telehealth: Payer: PRIVATE HEALTH INSURANCE | Admitting: Physician Assistant

## 2018-04-03 DIAGNOSIS — R220 Localized swelling, mass and lump, head: Secondary | ICD-10-CM

## 2018-04-03 NOTE — Progress Notes (Signed)
Based on what you shared with me it looks like you have a serious condition that should be evaluated in a face to face office visit.  NOTE: If you entered your credit card information for this eVisit, you will not be charged. You may see a "hold" on your card for the $30 but that hold will drop off and you will not have a charge processed.  If you are having a true medical emergency please call 911.  If you need an urgent face to face visit, [facility] has four urgent care centers for your convenience.  If you need care fast and have a high deductible or no insurance consider:   [email or web address] to reserve your spot online an avoid wait times  [facility] [address], [address] [place], [place] [address] 8 am to 8 pm Monday-Friday 10 am to 4 pm Saturday-Sunday *Across the street from [employer]  [facility]  [address] [place], [address] 8 am to 5 pm Monday-Friday * In the [facility]   The following sites will take your  insurance:  . [facility]  [phone] Get Driving Directions Find a Provider at this Location  [address] [place], [place] [address] . 10 am to 8 pm Monday-Friday . 12 pm to 8 pm Saturday-Sunday   . [facility]  [phone] Get Driving Directions Find a Provider at this Location  [address], [address] [place], [place] [address] . 8 am to 8 pm Monday-Friday . 9 am to 6 pm Saturday . 11 am to 6 pm Sunday   .  [facility]gent [facility] at [facility] [place]  [phone] Get Driving Directions  [address] [place],  [address] . 8 am to 8 pm Monday-Friday . 8 am to 4 pm Saturday-Sunday   Your e-visit answers were reviewed by a board certified advanced clinical practitioner to complete your personal care plan.  Thank you for using e-Visits.  
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
## 2018-04-06 ENCOUNTER — Ambulatory Visit (INDEPENDENT_AMBULATORY_CARE_PROVIDER_SITE_OTHER): Payer: PRIVATE HEALTH INSURANCE | Admitting: Family Medicine

## 2018-04-06 ENCOUNTER — Encounter: Payer: Self-pay | Admitting: Family Medicine

## 2018-04-06 VITALS — BP 120/70 | HR 67 | Temp 98.0°F | Resp 12 | Ht 65.5 in | Wt 139.4 lb

## 2018-04-06 DIAGNOSIS — B009 Herpesviral infection, unspecified: Secondary | ICD-10-CM

## 2018-04-06 DIAGNOSIS — R238 Other skin changes: Secondary | ICD-10-CM

## 2018-04-06 MED ORDER — VALACYCLOVIR HCL 1 G PO TABS: 1000.0000 mg | ORAL_TABLET | Freq: Two times a day (BID) | ORAL | 0 refills | Status: AC

## 2018-04-06 MED ORDER — LIDOCAINE 5 % EX OINT: 1.0000 "application " | TOPICAL_OINTMENT | Freq: Three times a day (TID) | CUTANEOUS | 0 refills | Status: DC | PRN

## 2018-04-06 NOTE — Patient Instructions (Signed)
Kimberly Wheeler I have seen you today for an acute visit.  A few things to remember from today's visit:   Herpetic lesions - Plan: valACYclovir (VALTREX) 1000 MG tablet  Vesicular rash   If medications prescribed today, they will not be refill upon request, a follow up appointment with PCP will be necessary to discuss continuation of of treatment if appropriate.    Cold Sore A cold sore, also called a fever blister, is a skin infection that causes small, fluid-filled sores to form inside of the mouth or on the lips, gums, nose, chin, or cheeks. Cold sores can spread to other parts of the body, such as the eyes or fingers. In some people with other medical conditions, cold sores can spread to multiple other body sites, including the genitals. Cold sores can be spread or passed from person to person (contagious) until the sores crust over completely. What are the causes? Cold sores are caused by the herpes simplex virus (HSV-1). HSV-1 is closely related to the virus that causes genital herpes (HSV-2), but these viruses are not the same. Once a person is infected with HSV-1, the virus remains permanently in the body. HSV-1 is spread from person to person through close contact, such as through kissing, touching the affected area, or sharing personal items such as lip balm, razors, or eating utensils. What increases the risk? A cold sore outbreak is more likely to develop in people who:  Are tired, stressed, or sick.  Are menstruating.  Are pregnant.  Take certain medicines.  Are exposed to cold weather or too much sun.  What are the signs or symptoms? Symptoms of a cold sore outbreak often go through different stages. Here is how a cold sore develops:  Tingling, itching, or burning is felt 1-2 days before the outbreak.  Fluid-filled blisters appear on the lips, inside the mouth, on the nose, or on the cheeks.  The blisters start to ooze clear fluid.  The blisters dry up and a  yellow crust appears in its place.  The crust falls off.  Other symptoms include:  Fever.  Sore throat.  Headache.  Muscle aches.  Swollen neck glands.  You also may not have any symptoms. How is this diagnosed? This condition is often diagnosed based on your medical history and a physical exam. Your health care provider may swab your sore and then examine it in the lab. Rarely, blood tests may be done to check for HSV-1. How is this treated? There is no cure for cold sores or HSV-1. There also is no vaccine for HSV-1. Most cold sores go away on their own without treatment within two weeks. Medicines cannot make the infection go away, but medicines can:  Help relieve some of the pain associated with the sores.  Work to stop the virus from multiplying.  Shorten healing time.  Medicines may be in the form of creams, gels, pills, or a shot. Follow these instructions at home: Medicines  Take or apply over-the-counter and prescription medicines only as told by your health care provider.  Use a cotton-tip swab to apply creams or gels to your sores. Sore Care  Do not touch the sores or pick the scabs.  Wash your hands often. Do not touch your eyes without washing your hands first.  Keep the sores clean and dry.  If directed, apply ice to the sores: ? Put ice in a plastic bag. ? Place a towel between your skin and the bag. ? Leave  the ice on for 20 minutes, 2-3 times per day. Lifestyle  Do not kiss, have oral sex, or share personal items until your sores heal.  Eat a soft, bland diet. Avoid eating hot, cold, or salty foods. These can hurt your mouth.  Use a straw if it hurts to drink out of a glass.  Avoid the sun and limit your stress if these things trigger outbreaks. If sun causes cold sores, apply sunscreen on your lips before being out in the sun. Contact a health care provider if:  You have symptoms for more than two weeks.  You have pus coming from the  sores.  You have redness that is spreading.  You have pain or irritation in your eye.  You get sores on your genitals.  Your sores do not heal within two weeks.  You have frequent cold sore outbreaks. Get help right away if:  You have a fever and your symptoms suddenly get worse.  You have a headache and confusion. This information is not intended to replace advice given to you by your health care provider. Make sure you discuss any questions you have with your health care provider. Document Released: 05/20/2000 Document Revised: 01/15/2016 Document Reviewed: 03/13/2015 Elsevier Interactive Patient Education  2018 [employer].   In general please monitor for signs of worsening symptoms and seek immediate medical attention if any concerning.  If symptoms are not resolved in 2 weeks you should schedule a follow up appointment with your doctor, before if needed.  I hope you get better soon!

## 2018-04-06 NOTE — Progress Notes (Signed)
ACUTE VISIT  HPI:  Chief Complaint  Patient presents with  . Mouth Lesions    off and on for 1 month     [name] is a 62 y.o.female here today complaining of a month of intermittent blister lesions and erythema on and around lips. Tingling and burning sensation periorally and on chin. No focal deficit, oral lesion,odynophagia,dysphagia,or stridor. She has not identified exacerbating or alleviating factors.   Rash  This is a new problem. The current episode started 1 to 4 weeks ago. The problem has been waxing and waning since onset. The affected locations include the lips. The rash is characterized by blistering and redness. Pertinent negatives include no congestion, cough, diarrhea, facial edema, fatigue, fever, shortness of breath, sore throat or vomiting. The treatment provided mild relief.    Negative for new medication, detergent, soap, or body product. No known insect bite or outdoor exposures to plants. No sick contact. No Hx of eczema or similar rash in the past.  OTC medication for this problem: Abreva, which helps temporarily.   Review of Systems  Constitutional: Negative for appetite change, chills, fatigue and fever.  HENT: Positive for mouth sores. Negative for congestion, sore throat and trouble swallowing.   Eyes: Negative for discharge and redness.  Respiratory: Negative for cough, shortness of breath and wheezing.   Gastrointestinal: Negative for abdominal pain, diarrhea, nausea and vomiting.  Musculoskeletal: Negative for myalgias and neck pain.  Skin: Positive for rash.  Allergic/Immunologic: Negative for environmental allergies.  Neurological: Negative for weakness and headaches.  Hematological: Positive for adenopathy. Does not bruise/bleed easily.      Current Outpatient Medications on File Prior to Visit  Medication Sig Dispense Refill  . ciprofloxacin (CIPRO) 500 MG tablet Take 1 tablet (500 mg total) by mouth 2 (two) times  daily. (Patient taking differently: Take 500 mg by mouth as needed. ) 10 tablet 0  . LORazepam (ATIVAN) 0.5 MG tablet Take 1 tablet (0.5 mg total) by mouth every 6 (six) hours as needed for anxiety. 60 tablet 2  . NON FORMULARY     . trimethoprim (TRIMPEX) 100 MG tablet Take 100 mg by mouth 2 (two) times daily.     No current facility-administered medications on file prior to visit.      Past Medical History:  Diagnosis Date  . Arthritis    fingers  . Chronic UTI (urinary tract infection)   . Fibroid   . Injury of fifth finger of right hand   . Lesion of bladder   . SVT (supraventricular tachycardia) (HCC)    Allergies  Allergen Reactions  . Darvon Nausea And Vomiting  . Macrodantin [Nitrofurantoin] Nausea Only    Social History   Socioeconomic History  . Marital status: Married    Spouse name: Not on file  . Number of children: 2  . Years of education: Not on file  . Highest education level: Not on file  Occupational History    Comment: Real estate broker  Social Needs  . Financial resource strain: Not on file  . Food insecurity:    Worry: Not on file    Inability: Not on file  . Transportation needs:    Medical: Not on file    Non-medical: Not on file  Tobacco Use  . Smoking status: Never Smoker  . Smokeless tobacco: Never Used  Substance and Sexual Activity  . Alcohol use: Yes    Comment: 3-5  . Drug use: No  .  Sexual activity: Yes    Partners: Male    Birth control/protection: Surgical    Comment: Tubal Ligation  Lifestyle  . Physical activity:    Days per week: Not on file    Minutes per session: Not on file  . Stress: Not on file  Relationships  . Social connections:    Talks on phone: Not on file    Gets together: Not on file    Attends religious service: Not on file    Active member of club or organization: Not on file    Attends meetings of clubs or organizations: Not on file    Relationship status: Not on file  Other Topics Concern  . Not  on file  Social History Narrative  . Not on file    Vitals:   04/06/18 0904  BP: 120/70  Pulse: 67  Resp: 12  Temp: 98 F (36.7 C)  SpO2: 97%   Body mass index is 22.84 kg/m.    Physical Exam  Nursing note and vitals reviewed. Constitutional: She is oriented to person, place, and time. She appears well-developed and well-nourished. No distress.  HENT:  Head: Normocephalic and atraumatic.  Mouth/Throat: Oropharynx is clear and moist and mucous membranes are normal.  Eyes: Conjunctivae are normal.  Respiratory: Effort normal and breath sounds normal. No respiratory distress.  Musculoskeletal: She exhibits no edema.  Lymphadenopathy:    She has no cervical adenopathy.  Neurological: She is alert and oriented to person, place, and time.  Skin: Skin is warm. Rash noted. Rash is vesicular. There is erythema.  Erythematous rash and some vesicular lesions on upper lip. Mild tenderness upon palpation. See picture.  Psychiatric: She has a normal mood and affect. Her speech is normal.  Well groomed, good eye contact.        ASSESSMENT AND PLAN:  Mr. [name] was seen today for mouth lesions.  Diagnoses and all orders for this visit:  Vesicular rash  We discussed possible etiologies, contact dermatitis and herpetic lesions among some. Avoiding liking lips.  -     lidocaine (XYLOCAINE) 5 % ointment; Apply 1 application topically 3 (three) times daily as needed for up to 14 days. On affected area.  Herpetic lesions  Because recurrent lesions for 10 days of Valtrex recommended. F/U as needed with PCP.  -     valACYclovir (VALTREX) 1000 MG tablet; Take 1 tablet (1,000 mg total) by mouth 2 (two) times daily for 10 days.     Theodore Virgin G. Martinique, MD  [facility]. [place] office.

## 2018-04-16 ENCOUNTER — Telehealth: Payer: Self-pay | Admitting: Obstetrics & Gynecology

## 2018-04-16 NOTE — Telephone Encounter (Signed)
Routing to Dr. Miller.   Encounter closed.  

## 2018-04-16 NOTE — Telephone Encounter (Signed)
Spoke with Kimberly Wheeler, requested OV notes and labs from [facility] 04/12/18. Written request for records be faxed to [phone].   Request faxed.

## 2018-04-16 NOTE — Telephone Encounter (Signed)
Spoke with patient. Patient requesting OV with Dr. Sabra Heck. Patient seen at [facility] on 04/12/18 for UTI symptoms, started on Cipro 500 mg bid x5 days. Urgent Care has not contacted patient results from UC.  Patient reports she is on last day of Cipro, pressure in vagina and burning have not resolved. Increased urinary frequency. Has had a partner change since [facility] in 08/09/17.   Denies dysuria, fever/chills, N/V, vaginal bleeding, lower back pain, vaginal d/c or odor.   OV scheduled with Dr. Sabra Heck for 11/12 at 3:15pm. Patient declined OV for today. Advised I will request copy of labs and OV notes from [place], [place].   Patient verbalizes understanding and is agreeable.

## 2018-04-16 NOTE — Telephone Encounter (Signed)
Patient is having some abdominal pain and pressure and would like to see Dr.Miller.

## 2018-04-16 NOTE — Telephone Encounter (Signed)
Records received and to Dr. Sabra Heck for review.

## 2018-04-17 ENCOUNTER — Ambulatory Visit (INDEPENDENT_AMBULATORY_CARE_PROVIDER_SITE_OTHER): Payer: PRIVATE HEALTH INSURANCE | Admitting: Obstetrics & Gynecology

## 2018-04-17 ENCOUNTER — Other Ambulatory Visit: Payer: Self-pay

## 2018-04-17 ENCOUNTER — Encounter: Payer: Self-pay | Admitting: Obstetrics & Gynecology

## 2018-04-17 VITALS — BP 130/80 | HR 92 | Resp 16 | Ht 65.5 in | Wt 139.8 lb

## 2018-04-17 DIAGNOSIS — R300 Dysuria: Secondary | ICD-10-CM | POA: Diagnosis not present

## 2018-04-17 DIAGNOSIS — K591 Functional diarrhea: Secondary | ICD-10-CM | POA: Diagnosis not present

## 2018-04-17 DIAGNOSIS — N898 Other specified noninflammatory disorders of vagina: Secondary | ICD-10-CM | POA: Diagnosis not present

## 2018-04-17 LAB — POCT URINALYSIS DIPSTICK
Bilirubin, UA: NEGATIVE
Blood, UA: NEGATIVE
GLUCOSE UA: NEGATIVE
KETONES UA: NEGATIVE
Leukocytes, UA: NEGATIVE
Nitrite, UA: NEGATIVE
PROTEIN UA: NEGATIVE
UROBILINOGEN UA: 0.2 U/dL
pH, UA: 5 (ref 5.0–8.0)

## 2018-04-17 MED ORDER — ESTRADIOL 0.1 MG/GM VA CREA: TOPICAL_CREAM | VAGINAL | 1 refills | Status: DC

## 2018-04-17 MED ORDER — PIMECROLIMUS 1 % EX CREA: TOPICAL_CREAM | Freq: Two times a day (BID) | CUTANEOUS | 0 refills | Status: DC

## 2018-04-17 NOTE — Progress Notes (Signed)
GYNECOLOGY  VISIT  CC:   Vaginal burning and discomfort  HPI: 62 y.o. G84P2002 Married White or Caucasian female here for vaginal irritation x 2 weeks.  She is having associated vaginal pressure that is internal and to the right as well.  She went to Urgent Care and I do have records.  Urine culture was obtained.  She was treated with ciprofloxin but this didn't help.  Also was treated with valtrex for possible oral HSV but pt reports irritation is all and has bene all the way around the lip without any single ulcerative lesion.  Denies vaginal bleeding.  Denies vaginal discharge or odor.  Would like vaginitis testing.    Has noticed that she has peri-oral irritation with drinking wheat beer which is her favorite.  She also has associated diarrhea.  She's never had testing for allergies done in the past.  D/w pt this may be appropriate.  Has associated GI distress as well.  Colonoscopy is scheduled for Friday.  D/w pt testing for celiac disease.  She is agreeable to this today.  GYNECOLOGIC HISTORY: Patient's last menstrual period was 03/07/2011. Contraception: BTL Menopausal hormone therapy: none  Patient Active Problem List   Diagnosis Date Noted  . Lesion of bladder   . Fibroid   . Chronic UTI (urinary tract infection)   . Arthritis   . SVT (supraventricular tachycardia) ([facility]) 05/24/2017  . Syncope 03/07/2017  . Intramural leiomyoma of uterus 11/06/2014  . Submucous leiomyoma of uterus 11/06/2014  . Subserous leiomyoma of uterus 11/06/2014  . ACUTE BRONCHITIS 06/25/2009  . ACUTE SINUSITIS, UNSPECIFIED 06/26/2007  . 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 UTI'S, HX OF 06/26/2007  . CHICKENPOX, HX OF 06/26/2007    Past Medical History:  Diagnosis Date  . Arthritis    fingers  . Chronic UTI (urinary tract infection)   . Fibroid   . Injury of fifth finger of right hand   . Lesion of bladder   . SVT (supraventricular tachycardia) (HCC)     Past Surgical History:   Procedure Laterality Date  . CESAREAN SECTION  1986  . CESAREAN SECTION W/BTL  1988  . COLONOSCOPY     at age 52, per Dr. Collene Mares, no polyps, repeat in 10 yrs   . CYSTOSCOPY W/ RETROGRADES  06/15/2011   Procedure: CYSTOSCOPY WITH RETROGRADE PYELOGRAM;  Surgeon: Molli Hazard, MD;  Location: [facility];  Service: Urology;  Laterality: Bilateral;  (BIL) RPG  . CYSTOSCOPY WITH BIOPSY  06/15/2011   Procedure: CYSTOSCOPY WITH BIOPSY;  Surgeon: Molli Hazard, MD;  Location: [facility];  Service: Urology;  Laterality: N/A;  BLADDER BIOPSY C-ARM CAMERA    . LOOP RECORDER INSERTION N/A 04/06/2017   Procedure: LOOP RECORDER INSERTION;  Surgeon: Evans Lance, MD;  Location: [facility];  Service: Cardiovascular;  Laterality: N/A;  . SALPINGOOPHORECTOMY  1991   LEFT  . SVT ABLATION N/A 05/24/2017   Procedure: SVT ABLATION;  Surgeon: Evans Lance, MD;  Location: [facility];  Service: Cardiovascular;  Laterality: N/A;    MEDS:   Current Outpatient Medications on File Prior to Visit  Medication Sig Dispense Refill  . NON FORMULARY     . LORazepam (ATIVAN) 0.5 MG tablet Take 1 tablet (0.5 mg total) by mouth every 6 (six) hours as needed for anxiety. (Patient not taking: Reported on 04/17/2018) 60 tablet 2   No current facility-administered medications on file prior to visit.  ALLERGIES: Wheat bran; Darvon; and Macrodantin [nitrofurantoin]  Family History  Problem Relation Age of Onset  . Osteoporosis Mother   . Prostate cancer Father   . Diabetes Maternal Grandmother   . Heart attack Maternal Grandmother   . Breast cancer Maternal Aunt     SH:  Married, non smoker  Review of Systems  Genitourinary: Positive for urgency.       Vaginal burning   All other systems reviewed and are negative.   PHYSICAL EXAMINATION:    BP 130/80 (BP Location: Right Arm, Patient Position: Sitting, Cuff Size: Large)   Pulse 92   Resp 16    Ht 5' 5.5" (1.664 m)   Wt 139 lb 12.8 oz (63.4 kg)   LMP 03/07/2011   BMI 22.91 kg/m     General appearance: alert, cooperative and appears stated age Mouth: erythema around lips Abdomen: soft, non-tender; bowel sounds normal; no masses,  no organomegaly Lymph:  no inguinal LAD noted  Pelvic: External genitalia:  no lesions              Urethra:  normal appearing urethra with no masses, tenderness or lesions              Bartholins and Skenes: normal                 Vagina: atrophic changes without discharge, no lesions                  Cervix: no lesions              Bimanual Exam:  Uterus:  normal size, contour, position, consistency, mobility, non-tender              Adnexa: no mass, fullness, tenderness  Chaperone was present for exam.  Assessment: Vaginal/vulvar irritation Recent issues with wheat bear including peri-oral erythema and GI distress Desires vaginitis and STD testing Mild atrophic changes  Plan: Nuswab obtained today Celiac disease lab work obtained today Elidel 1% cream bid Estrace vaginal cream 1 gram pv twice weekly.  #42.5 gram tube Will follow up with pt once testing is complete for additional recommendations.   ~25 minutes spent with patient >50% of time was in face to face discussion of above.

## 2018-04-18 ENCOUNTER — Telehealth: Payer: Self-pay | Admitting: Obstetrics & Gynecology

## 2018-04-18 ENCOUNTER — Other Ambulatory Visit: Payer: Self-pay | Admitting: Obstetrics & Gynecology

## 2018-04-18 MED ORDER — ESTROGENS, CONJUGATED 0.625 MG/GM VA CREA: TOPICAL_CREAM | VAGINAL | 6 refills | Status: DC

## 2018-04-18 MED ORDER — ERYTHROMYCIN 2 % EX GEL: Freq: Two times a day (BID) | CUTANEOUS | 0 refills | Status: DC

## 2018-04-18 NOTE — Telephone Encounter (Signed)
Kimberly Wheeler from [facility] called stating that the cost of the Estrace 0.1MG  vaginal cream and the Elidel 1% cream was over $100 for each of the medications. They are calling to see if there is an equivalent or other option for those medications.

## 2018-04-18 NOTE — Telephone Encounter (Signed)
Rx for premarin 1/2 gram pv twice weekly sent to [facility].  #30gm/0RF.  Also sent rx for erythromycin 2% gel to use BID around lips.  I am not sure about cost for either of these.  Please have her call if these are costly as well. Thanks.

## 2018-04-18 NOTE — Telephone Encounter (Signed)
Spoke with patient. Advised as seen below per Dr. Sabra Heck. Patient is going to f/u with pharmacy to compare cost, will return call to office to advise which prescriptions she plans to start. Patient verbalizes understanding.

## 2018-04-18 NOTE — Telephone Encounter (Signed)
Spoke with Harmon Pier at [facility]. Was advised both Rx are covered by patients plan, but copays are $120 and $180, patient requesting alternative for Estrace vaginal cream and Elidel.   Routing to Dr. Sabra Heck to advise on alternative RX.

## 2018-04-19 LAB — GLIADIN ANTIBODIES, SERUM
Antigliadin Abs, IgA: 2 units (ref 0–19)
Gliadin IgG: 2 units (ref 0–19)

## 2018-04-19 LAB — TISSUE TRANSGLUTAMINASE, IGA: Transglutaminase IgA: <2 U/mL (ref 0–3)

## 2018-04-19 LAB — RETICULIN ANTIBODIES, IGA W TITER: Reticulin Ab, IgA: NEGATIVE titer (ref <2.5)

## 2018-04-20 ENCOUNTER — Encounter: Payer: Self-pay | Admitting: Obstetrics & Gynecology

## 2018-04-20 NOTE — Telephone Encounter (Signed)
Left message to call [name], RN at [facility] [phone].   

## 2018-04-21 LAB — NUSWAB VAGINITIS PLUS (VG+)
CANDIDA ALBICANS, NAA: NEGATIVE
CANDIDA GLABRATA, NAA: NEGATIVE
Chlamydia trachomatis, NAA: NEGATIVE
NEISSERIA GONORRHOEAE, NAA: NEGATIVE
TRICH VAG BY NAA: NEGATIVE

## 2018-04-23 ENCOUNTER — Ambulatory Visit: Payer: Self-pay

## 2018-04-23 NOTE — Telephone Encounter (Signed)
She had two major issues with last visit--lips and vaginal/vulvar.  With negative vaginitis testing, she needs to use the vaginal estrogen cream twice weekly and I would like to recheck in 4-6 weeks.  Her lip/face issues are really outside my expertise area.  I think dermatology follow up is appropriate.  [facility] [address] would be my recommendations.

## 2018-04-23 NOTE — Telephone Encounter (Signed)
Spoke with patient.   1. Medications updated. Patient started Elidel cream for lip, working well. Started Estrace.   2. Patient reports she still continues to have lip "burning and tingling sensation". Patient reports glands in jaw are now swollen and feels an intermittent tingling sensation on left side of body. The same sensation as she is feeling in lips. Reports "cold flashes". Denies fever, SOB, weakness, pain. Instructed patient to f/u with PCP/Urgent Care/ER for further evaluation of symptoms.   3. Patient request vaginitis results: advised 04/17/18 vaginitis panel negative for BV, yeast, Trich, Chlamydia, and Gonorrhea. Dr. Sabra Heck will review and if any additional recommendations will return call.   Patient verbalizes understanding.   Routing to Dr. Sabra Heck to review labs, any additional recommendations?

## 2018-04-23 NOTE — Telephone Encounter (Signed)
Dr Fry - FYI. Thanks! 

## 2018-04-23 NOTE — Telephone Encounter (Signed)
Pt called back stating that she had a rash around her lips and was given medication for herpes but it did not help; she also said sshe says that her lips are still tingling and burning; she says that she was seen on 04/17/18 by her GYN and was given steroids to reduce the swelling of her lips; she says that the lesions on her lips did clear up but the lymph nodes under both of her jaws are a little swollen R>L; nurse triage initiated and  Recommendations made per protocol; pt offered and accepted appointment with Dr Sarajane Jews, [facility], 04/24/18 at 1115; she verbalized understanding; will route to office for notification of this upcoming appointment.   Reason for Disposition . [1] Large node AND [2] present > 2 weeks  Answer Assessment - Initial Assessment Questions 1. LOCATION: "Where is the swollen node located?" "Is the matching node on the other side of the body also swollen?"      bil jaws R>L 2. SIZE: "How big is the node?" (Inches or centimeters) (or compare to common objects such as pea, bean, marble, golf ball)      Pea sized 3. ONSET: "When did the swelling start?"      Seen in office 04/06/18 4. NECK NODES: "Is there a sore throat, runny nose or other symptoms of a cold?"      no 5. GROIN OR ARMPIT NODES: "Is there a sore, scratch, cut or painful red area on that arm or leg?"      no 6. FEVER: "Do you have a fever?" If so, ask: "What is it, how was it measured, and when did it start?"      no 7. CAUSE: "What do you think is causing the swollen lymph nodes?"     Not sure 8. OTHER SYMPTOMS: "Do you have any other symptoms?"     no 9. PREGNANCY: "Is there any chance you are pregnant?" "When was your last menstrual period?"     no  Protocols used: [place]

## 2018-04-23 NOTE — Telephone Encounter (Signed)
Spoke with patient. Advised as seen below per Dr. Sabra Heck. OV scheduled for 06/01/18 at 11:30am.   Patient is scheduled for OV with her PCP 11/19. Patient thankful for assistance and verbalizes understanding.   Routing to provider for final review. Patient is agreeable to disposition. Will close encounter.

## 2018-04-23 NOTE — Telephone Encounter (Signed)
Left message to call Alleene Stoy, RN at [facility] [phone].   

## 2018-04-24 ENCOUNTER — Encounter: Payer: Self-pay | Admitting: Family Medicine

## 2018-04-24 ENCOUNTER — Ambulatory Visit (INDEPENDENT_AMBULATORY_CARE_PROVIDER_SITE_OTHER): Payer: PRIVATE HEALTH INSURANCE | Admitting: Family Medicine

## 2018-04-24 VITALS — BP 120/82 | HR 68 | Temp 98.1°F | Wt 135.4 lb

## 2018-04-24 DIAGNOSIS — K121 Other forms of stomatitis: Secondary | ICD-10-CM

## 2018-04-24 DIAGNOSIS — G629 Polyneuropathy, unspecified: Secondary | ICD-10-CM | POA: Diagnosis not present

## 2018-04-24 LAB — CBC WITH DIFFERENTIAL/PLATELET
Basophils Absolute: 0 10*3/uL (ref 0.0–0.1)
Basophils Relative: 0.7 % (ref 0.0–3.0)
EOS PCT: 0.6 % (ref 0.0–5.0)
Eosinophils Absolute: 0 10*3/uL (ref 0.0–0.7)
HEMATOCRIT: 41.3 % (ref 36.0–46.0)
HEMOGLOBIN: 14.1 g/dL (ref 12.0–15.0)
LYMPHS PCT: 21.5 % (ref 12.0–46.0)
Lymphs Abs: 1.2 10*3/uL (ref 0.7–4.0)
MCHC: 34.3 g/dL (ref 30.0–36.0)
MCV: 92.3 fl (ref 78.0–100.0)
MONOS PCT: 6.2 % (ref 3.0–12.0)
Monocytes Absolute: 0.4 10*3/uL (ref 0.1–1.0)
Neutro Abs: 4 10*3/uL (ref 1.4–7.7)
Neutrophils Relative %: 71 % (ref 43.0–77.0)
Platelets: 225 10*3/uL (ref 150.0–400.0)
RBC: 4.47 Mil/uL (ref 3.87–5.11)
RDW: 12.7 % (ref 11.5–15.5)
WBC: 5.6 10*3/uL (ref 4.0–10.5)

## 2018-04-24 LAB — VITAMIN D 25 HYDROXY (VIT D DEFICIENCY, FRACTURES): VITD: 30.35 ng/mL (ref 30.00–100.00)

## 2018-04-24 LAB — HEPATIC FUNCTION PANEL
ALT: 15 U/L (ref 0–35)
AST: 17 U/L (ref 0–37)
Albumin: 4.6 g/dL (ref 3.5–5.2)
Alkaline Phosphatase: 40 U/L (ref 39–117)
BILIRUBIN TOTAL: 0.8 mg/dL (ref 0.2–1.2)
Bilirubin, Direct: 0.1 mg/dL (ref 0.0–0.3)
Total Protein: 7.2 g/dL (ref 6.0–8.3)

## 2018-04-24 LAB — BASIC METABOLIC PANEL
BUN: 8 mg/dL (ref 6–23)
CO2: 28 mEq/L (ref 19–32)
CREATININE: 0.74 mg/dL (ref 0.40–1.20)
Calcium: 9.8 mg/dL (ref 8.4–10.5)
Chloride: 102 mEq/L (ref 96–112)
GFR: 84.76 mL/min (ref >60.00)
Glucose, Bld: 95 mg/dL (ref 70–99)
Potassium: 4 mEq/L (ref 3.5–5.1)
Sodium: 141 mEq/L (ref 135–145)

## 2018-04-24 LAB — VITAMIN B12: VITAMIN B 12: 272 pg/mL (ref 211–911)

## 2018-04-24 LAB — TSH: TSH: 1.96 u[IU]/mL (ref 0.35–4.50)

## 2018-04-24 NOTE — Progress Notes (Signed)
   Subjective:    Patient ID: Kimberly Wheeler, female    DOB: [birth year], 62 y.o.   MRN: [id number]  HPI Here for 6 weeks of soreness and irritation and tiny blisters around the lips. No recent changes in medications or soaps or lipstick or makeup. She has also developed some slight numbness and tingling in both arms and hands for the past 3 weeks. Now for the past few days she has noticed some slight swelling of the lymph nodes under both jaws. No internal mouth lesions or ST. No fever. No other areas of skin involvement. She was seen here on 04-06-18 and it was felt this could be a herpetic breakout. She was given Valtrex but this did not help. Then she saw Dr. Sabra Heck, her GYN, on 04-17-18 and she did labs to check for celiac disease (these were negative). She also gave her Elidel cream to apply and this as actually soothed her lips to a degree. Of note Kimberly Wheeler has a hx of vitamin B12 deficiency and she took shots for this for about a year or two. This has not been checked since 2014.     Review of Systems  Constitutional: Negative.   HENT: Negative for mouth sores, postnasal drip, sinus pressure, sneezing, sore throat and trouble swallowing.   Eyes: Negative.   Respiratory: Negative.   Cardiovascular: Negative.   Gastrointestinal: Negative.   Skin: Positive for rash.  Neurological: Positive for numbness.       Objective:   Physical Exam  Constitutional: She is oriented to person, place, and time. She appears well-developed and well-nourished.  HENT:  Right Ear: External ear normal.  Left Ear: External ear normal.  Nose: Nose normal.  Both lips are slightly swollen and red, no vesicles or ulcers are seen   Eyes: Conjunctivae are normal.  Neck: Neck supple. No thyromegaly present.  Cardiovascular: Normal rate, regular rhythm, normal heart sounds and intact distal pulses.  Pulmonary/Chest: Effort normal and breath sounds normal.  Lymphadenopathy:    She has no cervical adenopathy.    Neurological: She is alert and oriented to person, place, and time.          Assessment & Plan:  Stomatitis with some cervical lymph adenopathy and some upper extremity neuropathy. All this is consistent with the effects of a B12 deficiency. We will get labs today including a B12 level.  Alysia Penna, MD

## 2018-04-27 ENCOUNTER — Encounter: Payer: Self-pay | Admitting: *Deleted

## 2018-04-27 NOTE — Telephone Encounter (Signed)
So vitamin B12 isn't in order? seems low, but I don't understand the levels. He had mentioned low B12 was cause of symptons. Is there something I need to be doing? I have added b12 to my daily vitamin schedule. Thanks in advance.    Dr. Sarajane Jews please advise. thanks

## 2018-04-27 NOTE — Telephone Encounter (Signed)
I agree her B12 was at the low end of normal. Lets try shots for 12 weeks and see if this helps. Start her on B12 shots 1000 mcg weekly for 12 weeks and then recheck a level.

## 2018-05-31 ENCOUNTER — Telehealth: Payer: Self-pay | Admitting: Obstetrics & Gynecology

## 2018-05-31 NOTE — Telephone Encounter (Signed)
Patient canceled her upcoming f/u vaginal estrogen appointment 06/01/18 via the automated reminder call. I left her a message to call and reschedule.

## 2018-06-01 ENCOUNTER — Ambulatory Visit: Payer: Self-pay | Admitting: Obstetrics & Gynecology

## 2018-06-13 ENCOUNTER — Other Ambulatory Visit: Payer: Self-pay | Admitting: Family Medicine

## 2018-06-15 NOTE — Telephone Encounter (Signed)
Dr. Sarajane Jews please advise on refill of medication.  thanks

## 2018-06-18 NOTE — Telephone Encounter (Signed)
Call in #[phone] with 5 rf 

## 2018-06-21 NOTE — Telephone Encounter (Signed)
Refill has been called to the pharmacy and left on the VM.  

## 2018-08-24 ENCOUNTER — Telehealth: Payer: Self-pay | Admitting: Certified Nurse Midwife

## 2018-08-24 NOTE — Telephone Encounter (Signed)
Left patient a message to call back to reschedule a future appointment that was cancelled by the provider. °

## 2018-08-29 ENCOUNTER — Ambulatory Visit: Payer: PRIVATE HEALTH INSURANCE | Admitting: Certified Nurse Midwife

## 2018-12-31 ENCOUNTER — Other Ambulatory Visit: Payer: Self-pay | Admitting: Certified Nurse Midwife

## 2018-12-31 ENCOUNTER — Other Ambulatory Visit: Payer: Self-pay | Admitting: Physical Medicine and Rehabilitation

## 2018-12-31 ENCOUNTER — Telehealth: Payer: Self-pay | Admitting: Certified Nurse Midwife

## 2018-12-31 DIAGNOSIS — Z1231 Encounter for screening mammogram for malignant neoplasm of breast: Secondary | ICD-10-CM

## 2018-12-31 NOTE — Telephone Encounter (Signed)
Patient says she tried to schedule her routine mammogram but says she was told she need an order for a diagnostic. Says she is not having any problems.

## 2018-12-31 NOTE — Telephone Encounter (Signed)
Left message to call Sharee Pimple, RN at [facility].    08/28/17 Bilateral Dx MMG at [facility], screening MMG recommended 14yr.   Spoke with Melody at [facility]. Reviewed last MMG and addended report. Patient is due for screening MMG. Melody will contact the patient directly to schedule screening MMG.   Encounter closed.

## 2019-01-16 ENCOUNTER — Other Ambulatory Visit: Payer: Self-pay | Admitting: Family Medicine

## 2019-01-16 NOTE — Telephone Encounter (Signed)
Last filled 06/21/2018 Last OV 04/24/18  Ok to fill?

## 2019-01-24 ENCOUNTER — Other Ambulatory Visit: Payer: Self-pay

## 2019-01-28 ENCOUNTER — Encounter: Payer: Self-pay | Admitting: Certified Nurse Midwife

## 2019-01-28 ENCOUNTER — Ambulatory Visit (INDEPENDENT_AMBULATORY_CARE_PROVIDER_SITE_OTHER): Payer: PRIVATE HEALTH INSURANCE | Admitting: Certified Nurse Midwife

## 2019-01-28 ENCOUNTER — Other Ambulatory Visit: Payer: Self-pay

## 2019-01-28 VITALS — BP 114/70 | HR 68 | Temp 97.1°F | Resp 16 | Ht 65.5 in | Wt 140.0 lb

## 2019-01-28 DIAGNOSIS — N951 Menopausal and female climacteric states: Secondary | ICD-10-CM | POA: Diagnosis not present

## 2019-01-28 DIAGNOSIS — Z01419 Encounter for gynecological examination (general) (routine) without abnormal findings: Secondary | ICD-10-CM | POA: Diagnosis not present

## 2019-01-28 DIAGNOSIS — N6001 Solitary cyst of right breast: Secondary | ICD-10-CM

## 2019-01-28 NOTE — Progress Notes (Signed)
63 y.o. G70P2002 Married  Caucasian Fe here for annual exam. Menopausal no HRT. Denies vaginal bleeding or vaginal dryness. Continues with Estrace  cream use to help with UTI prevention, working well.  Has been monitoring right breast for change and not noted any. Mammogram overdue because of rescheduling. Now being treated for B 12 deficiency which is stabilizing. Has lost some weight during the interim for total of  11 pounds. Had SVT fixed but monitor still present. Has not had to see cardiology since completed Sees PCP yearly for labs and exam.No health issues today.  Patient's last menstrual period was 03/07/2011.          Sexually active: Yes.    The current method of family planning is tubal ligation.    Exercising: Yes.    walking, farm work Smoker:  no  Review of Systems  Constitutional: Negative.   HENT: Negative.   Eyes: Negative.   Respiratory: Negative.   Cardiovascular: Negative.   Gastrointestinal: Negative.   Genitourinary: Negative.   Musculoskeletal: Negative.   Skin: Negative.   Neurological: Negative.   Endo/Heme/Allergies: Negative.   Psychiatric/Behavioral: Negative.     Health Maintenance: Pap:  08-05-16 neg HPV HR neg History of Abnormal Pap: no MMG:  3/19 bilateral & rt breast u/s category c density birads 2:neg Self Breast exams: yes Colonoscopy:  2019 f/u 37yrs BMD:   none TDaP:  2016 Shingles: no Pneumonia: no Hep C and HIV: both neg 2017 Labs: if needed   reports that she has never smoked. She has never used smokeless tobacco. She reports current alcohol use. She reports that she does not use drugs.  Past Medical History:  Diagnosis Date  . Arthritis    fingers  . Chronic UTI (urinary tract infection)   . Fibroid   . Injury of fifth finger of right hand   . Lesion of bladder   . SVT (supraventricular tachycardia) (HCC)     Past Surgical History:  Procedure Laterality Date  . CESAREAN SECTION  1986  . CESAREAN SECTION W/BTL  1988  .  COLONOSCOPY     at age 88, per Dr. Collene Mares, no polyps, repeat in 10 yrs   . CYSTOSCOPY W/ RETROGRADES  06/15/2011   Procedure: CYSTOSCOPY WITH RETROGRADE PYELOGRAM;  Surgeon: Molli Hazard, MD;  Location: [facility];  Service: Urology;  Laterality: Bilateral;  (BIL) RPG  . CYSTOSCOPY WITH BIOPSY  06/15/2011   Procedure: CYSTOSCOPY WITH BIOPSY;  Surgeon: Molli Hazard, MD;  Location[facility];  Service: Urology;  Laterality: N/A;  BLADDER BIOPSY C-ARM CAMERA    . LOOP RECORDER INSERTION N/A 04/06/2017   Procedure: LOOP RECORDER INSERTION;  Surgeon: Evans Lance, MD;  Location: [facility];  Service: Cardiovascular;  Laterality: N/A;  . SALPINGOOPHORECTOMY  1991   LEFT  . SVT ABLATION N/A 05/24/2017   Procedure: SVT ABLATION;  Surgeon: Evans Lance, MD;  Location: [place] [facility] [facility];  Service: Cardiovascular;  Laterality: N/A;    Current Outpatient Medications  Medication Sig Dispense Refill  . Cyanocobalamin (B-12 PO) Take by mouth.    . estradiol (ESTRACE) 0.1 MG/GM vaginal cream 1 gram vaginally twice weekly 42.5 g 1  . LORazepam (ATIVAN) 0.5 MG tablet TAKE 1 TABLET BY MOUTH EVERY 6 HOURS AS NEEDED FOR ANXIETY 60 tablet 5   No current facility-administered medications for this visit.     Family History  Problem Relation Age of Onset  . Osteoporosis Mother   .  Prostate cancer Father   . Diabetes Maternal Grandmother   . Heart attack Maternal Grandmother   . Breast cancer Maternal Aunt     ROS:  Pertinent items are noted in HPI.  Otherwise, a comprehensive ROS was negative.  Exam:   BP 114/70   Pulse 68   Temp (!) 97.1 F (36.2 C) (Skin)   Resp 16   Ht 5' 5.5" (1.664 m)   Wt 140 lb (63.5 kg)   LMP 03/07/2011   BMI 22.94 kg/m  Height: 5' 5.5" (166.4 cm) Ht Readings from Last 3 Encounters:  01/28/19 5' 5.5" (1.664 m)  04/17/18 5' 5.5" (1.664 m)  04/06/18 5' 5.5" (1.664 m)    General appearance: alert,  cooperative and appears stated age Head: Normocephalic, without obvious abnormality, atraumatic Neck: no adenopathy, supple, symmetrical, trachea midline and thyroid normal to inspection and palpation Lungs: clear to auscultation bilaterally Breasts: normal appearance, no masses or tenderness, No nipple retraction or dimpling, No nipple discharge or bleeding, No axillary or supraclavicular adenopathy Heart: regular rate and rhythm Abdomen: soft, non-tender; no masses,  no organomegaly Extremities: extremities normal, atraumatic, no cyanosis or edema Skin: Skin color, texture, turgor normal. No rashes or lesions Lymph nodes: Cervical, supraclavicular, and axillary nodes normal. No abnormal inguinal nodes palpated Neurologic: Grossly normal   Pelvic: External genitalia:  no lesions, normal female              Urethra:  normal appearing urethra with no masses, tenderness or lesions              Bartholin's and Skene's: normal                 Vagina: normal appearing vagina with normal color and discharge, no lesions              Cervix: no cervical motion tenderness and no lesions              Pap taken: No. Bimanual Exam:  Uterus:  normal size, contour, position, consistency, mobility, non-tender and anteverted, slight nodular feel (history of fibroid)              Adnexa: no mass, fullness, tenderness               Rectovaginal: Confirms               Anus:  normal sphincter tone, no lesions  Chaperone present: yes   A:  Well Woman with normal exam  Menopausal using Estrace cream prn for UTI prevention  History of fibroid no change in exam  History of SVT corrected      P:   Reviewed health and wellness pertinent to exam  Aware to advise if vaginal bleeding, no refill needed  Continue follow up with Cardiology and PCP as indicated  Pap smear: no   counseled on breast self exam, mammography screening, feminine hygiene, menopause, adequate intake of calcium and vitamin D, diet  and exercise  return annually or prn  An After Visit Summary was printed and given to the patient.

## 2019-02-21 ENCOUNTER — Ambulatory Visit: Payer: PRIVATE HEALTH INSURANCE

## 2019-03-21 ENCOUNTER — Other Ambulatory Visit: Payer: Self-pay | Admitting: Obstetrics & Gynecology

## 2019-03-21 DIAGNOSIS — Z1231 Encounter for screening mammogram for malignant neoplasm of breast: Secondary | ICD-10-CM

## 2019-05-09 ENCOUNTER — Ambulatory Visit
Admission: RE | Admit: 2019-05-09 | Discharge: 2019-05-09 | Disposition: A | Discharge status: Home / Self Care | Payer: 59 | Source: Ambulatory Visit | Attending: Obstetrics & Gynecology | Admitting: Obstetrics & Gynecology

## 2019-05-09 ENCOUNTER — Other Ambulatory Visit: Payer: Self-pay

## 2019-05-09 DIAGNOSIS — Z1231 Encounter for screening mammogram for malignant neoplasm of breast: Secondary | ICD-10-CM

## 2019-06-04 ENCOUNTER — Encounter: Payer: PRIVATE HEALTH INSURANCE | Admitting: Family Medicine

## 2019-06-19 ENCOUNTER — Encounter: Payer: Self-pay | Admitting: Family Medicine

## 2019-06-19 ENCOUNTER — Ambulatory Visit (INDEPENDENT_AMBULATORY_CARE_PROVIDER_SITE_OTHER): Payer: 59 | Admitting: Family Medicine

## 2019-06-19 ENCOUNTER — Other Ambulatory Visit: Payer: Self-pay

## 2019-06-19 VITALS — BP 130/62 | HR 90 | Temp 97.6°F | Ht 66.0 in | Wt 144.6 lb

## 2019-06-19 DIAGNOSIS — Z0000 Encounter for general adult medical examination without abnormal findings: Secondary | ICD-10-CM

## 2019-06-19 DIAGNOSIS — R739 Hyperglycemia, unspecified: Secondary | ICD-10-CM | POA: Diagnosis not present

## 2019-06-19 MED ORDER — LORAZEPAM 0.5 MG PO TABS: 0.5000 mg | ORAL_TABLET | Freq: Four times a day (QID) | ORAL | 5 refills | Status: DC | PRN

## 2019-06-19 NOTE — Progress Notes (Signed)
   Subjective:    Patient ID: Kimberly Wheeler, female    DOB: 1955/11/12, 64 y.o.   MRN: [id number]  HPI Here for a well exam. She feels well and has no complaints. She had been under a lot of stress this past year with family problems, but she left her husband and they are now separating. She lived with friends for awhile and now she has her own place to stay. She feels more relaxed and is much happier.    Review of Systems  Constitutional: Negative.   HENT: Negative.   Eyes: Negative.   Respiratory: Negative.   Cardiovascular: Negative.   Gastrointestinal: Negative.   Genitourinary: Negative for decreased urine volume, difficulty urinating, dyspareunia, dysuria, enuresis, flank pain, frequency, hematuria, pelvic pain and urgency.  Musculoskeletal: Negative.   Skin: Negative.   Neurological: Negative.   Psychiatric/Behavioral: Negative.        Objective:   Physical Exam Constitutional:      General: She is not in acute distress.    Appearance: She is well-developed.  HENT:     Head: Normocephalic and atraumatic.     Right Ear: External ear normal.     Left Ear: External ear normal.     Nose: Nose normal.     Mouth/Throat:     Pharynx: No oropharyngeal exudate.  Eyes:     General: No scleral icterus.    Conjunctiva/sclera: Conjunctivae normal.     Pupils: Pupils are equal, round, and reactive to light.  Neck:     Thyroid: No thyromegaly.     Vascular: No JVD.  Cardiovascular:     Rate and Rhythm: Normal rate and regular rhythm.     Heart sounds: Normal heart sounds. No murmur. No friction rub. No gallop.   Pulmonary:     Effort: Pulmonary effort is normal. No respiratory distress.     Breath sounds: Normal breath sounds. No wheezing or rales.  Chest:     Chest wall: No tenderness.  Abdominal:     General: Bowel sounds are normal. There is no distension.     Palpations: Abdomen is soft. There is no mass.     Tenderness: There is no abdominal tenderness. There is no  guarding or rebound.  Musculoskeletal:        General: No tenderness. Normal range of motion.     Cervical back: Normal range of motion and neck supple.  Lymphadenopathy:     Cervical: No cervical adenopathy.  Skin:    General: Skin is warm and dry.     Findings: No erythema or rash.  Neurological:     Mental Status: She is alert and oriented to person, place, and time.     Cranial Nerves: No cranial nerve deficit.     Motor: No abnormal muscle tone.     Coordination: Coordination normal.     Deep Tendon Reflexes: Reflexes are normal and symmetric. Reflexes normal.  Psychiatric:        Behavior: Behavior normal.        Thought Content: Thought content normal.        Judgment: Judgment normal.           Assessment & Plan:  Well exam. We discussed diet and exercise. Get fasting labs soon.  Alysia Penna, MD

## 2019-06-20 ENCOUNTER — Other Ambulatory Visit (INDEPENDENT_AMBULATORY_CARE_PROVIDER_SITE_OTHER): Payer: 59

## 2019-06-20 DIAGNOSIS — Z0000 Encounter for general adult medical examination without abnormal findings: Secondary | ICD-10-CM | POA: Diagnosis not present

## 2019-06-20 LAB — LIPID PANEL
Cholesterol: 234 mg/dL — ABNORMAL HIGH (ref 0–200)
HDL: 102.2 mg/dL (ref >39.00)
LDL Cholesterol: 115 mg/dL — ABNORMAL HIGH (ref 0–99)
NonHDL: 131.31
Total CHOL/HDL Ratio: 2
Triglycerides: 81 mg/dL (ref 0.0–149.0)
VLDL: 16.2 mg/dL (ref 0.0–40.0)

## 2019-06-20 LAB — BASIC METABOLIC PANEL
BUN: 8 mg/dL (ref 6–23)
CO2: 29 mEq/L (ref 19–32)
Calcium: 9.5 mg/dL (ref 8.4–10.5)
Chloride: 100 mEq/L (ref 96–112)
Creatinine, Ser: 0.77 mg/dL (ref 0.40–1.20)
GFR: 75.89 mL/min (ref >60.00)
Glucose, Bld: 143 mg/dL — ABNORMAL HIGH (ref 70–99)
Potassium: 4.1 mEq/L (ref 3.5–5.1)
Sodium: 138 mEq/L (ref 135–145)

## 2019-06-20 LAB — CBC WITH DIFFERENTIAL/PLATELET
Basophils Absolute: 0.1 10*3/uL (ref 0.0–0.1)
Basophils Relative: 0.8 % (ref 0.0–3.0)
Eosinophils Absolute: 0.1 10*3/uL (ref 0.0–0.7)
Eosinophils Relative: 0.9 % (ref 0.0–5.0)
HCT: 41.2 % (ref 36.0–46.0)
Hemoglobin: 13.9 g/dL (ref 12.0–15.0)
Lymphocytes Relative: 13.4 % (ref 12.0–46.0)
Lymphs Abs: 0.8 10*3/uL (ref 0.7–4.0)
MCHC: 33.9 g/dL (ref 30.0–36.0)
MCV: 93.2 fl (ref 78.0–100.0)
Monocytes Absolute: 0.3 10*3/uL (ref 0.1–1.0)
Monocytes Relative: 4.7 % (ref 3.0–12.0)
Neutro Abs: 5.1 10*3/uL (ref 1.4–7.7)
Neutrophils Relative %: 80.2 % — ABNORMAL HIGH (ref 43.0–77.0)
Platelets: 230 10*3/uL (ref 150.0–400.0)
RBC: 4.42 Mil/uL (ref 3.87–5.11)
RDW: 12.9 % (ref 11.5–15.5)
WBC: 6.3 10*3/uL (ref 4.0–10.5)

## 2019-06-20 LAB — HEPATIC FUNCTION PANEL
ALT: 12 U/L (ref 0–35)
AST: 15 U/L (ref 0–37)
Albumin: 4.4 g/dL (ref 3.5–5.2)
Alkaline Phosphatase: 38 U/L — ABNORMAL LOW (ref 39–117)
Bilirubin, Direct: 0.2 mg/dL (ref 0.0–0.3)
Total Bilirubin: 0.9 mg/dL (ref 0.2–1.2)
Total Protein: 6.7 g/dL (ref 6.0–8.3)

## 2019-06-20 LAB — VITAMIN B12: Vitamin B-12: 371 pg/mL (ref 211–911)

## 2019-06-20 LAB — TSH: TSH: 1.82 u[IU]/mL (ref 0.35–4.50)

## 2019-06-20 LAB — HEMOGLOBIN A1C: Hgb A1c MFr Bld: 4.9 % (ref 4.6–6.5)

## 2019-06-20 NOTE — Addendum Note (Signed)
Addended by: Alverda Skeans on: 06/20/2019 09:25 AM   Modules accepted: Orders

## 2019-07-29 ENCOUNTER — Other Ambulatory Visit: Payer: Self-pay

## 2019-07-30 ENCOUNTER — Encounter: Payer: Self-pay | Admitting: Family Medicine

## 2019-07-30 ENCOUNTER — Ambulatory Visit (INDEPENDENT_AMBULATORY_CARE_PROVIDER_SITE_OTHER): Payer: 59 | Admitting: Family Medicine

## 2019-07-30 VITALS — BP 130/68 | HR 95 | Temp 98.2°F | Wt 142.2 lb

## 2019-07-30 DIAGNOSIS — R221 Localized swelling, mass and lump, neck: Secondary | ICD-10-CM | POA: Diagnosis not present

## 2019-07-30 DIAGNOSIS — H9311 Tinnitus, right ear: Secondary | ICD-10-CM | POA: Diagnosis not present

## 2019-07-30 NOTE — Progress Notes (Signed)
   Subjective:    Patient ID: Kimberly Wheeler, female    DOB: [birth year], 64 y.o.   MRN: [id number]  HPI Here to discuss some vague symptoms that she has noticed for the past month. She had a wellness exam here a month ago, and she did not mention any of these symptoms at that time. She describes a vague pressure in the head and a mild  Lightheadedness. She gets slightly dizzy at times but the room does not spin. No headaches or blurred vision. 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 She thinks her hearing is okay. She has also had a small lump under the right jawbone for the past year, and she thinks it is a lymph node. It never changes and it does not bother her. No trouble with speech or swallowing. She purchased a BP cuff recently and her BP at home has ranged 98-107 over 70-80. All her lab work last month was unremarkable. She does admit to a lot of stress lately, as we discussed last month, due to the fact that she is going through a divorce.    Review of Systems  Constitutional: Negative.   HENT: Positive for tinnitus. Negative for congestion, ear discharge, ear pain, facial swelling, hearing loss, postnasal drip, sinus pressure, sinus pain, trouble swallowing and voice change.   Eyes: Negative.   Respiratory: Negative.   Gastrointestinal: Negative.   Genitourinary: Negative.   Neurological: Positive for light-headedness. Negative for tremors, seizures, syncope, facial asymmetry, speech difficulty, weakness, numbness and headaches.       Objective:   Physical Exam Constitutional:      General: She is not in acute distress.    Appearance: Normal appearance.  HENT:     Head: Normocephalic and atraumatic.     Right Ear: Tympanic membrane, ear canal and external ear normal.     Left Ear: Tympanic membrane, ear canal and external ear normal.     Nose: Nose normal.     Mouth/Throat:     Pharynx: Oropharynx is clear.  Eyes:     Conjunctiva/sclera:  Conjunctivae normal.  Neck:     Comments: There is a small mobile non-tender lump under the right mandible consistent with a salivary gland  Cardiovascular:     Rate and Rhythm: Normal rate and regular rhythm.     Pulses: Normal pulses.     Heart sounds: Normal heart sounds.  Pulmonary:     Effort: Pulmonary effort is normal.     Breath sounds: Normal breath sounds.  Musculoskeletal:     Cervical back: No rigidity.  Lymphadenopathy:     Cervical: No cervical adenopathy.  Neurological:     General: No focal deficit present.     Mental Status: She is alert and oriented to person, place, and time. Mental status is at baseline.           Assessment & Plan:  She has some vague poorly described symptoms that seem to be centered around the right side of her head and neck. She also has a small blocked benign salivary gland in the right neck. I think all these symptoms are the result of stress, and we talked about ways to reduce her stress like exercise. To be complete, we will refer her to ENT to evaluate her further.  Alysia Penna, MD

## 2019-08-27 ENCOUNTER — Encounter: Payer: Self-pay | Admitting: Certified Nurse Midwife

## 2020-02-04 ENCOUNTER — Other Ambulatory Visit: Payer: Self-pay | Admitting: Family Medicine

## 2020-02-04 NOTE — Telephone Encounter (Signed)
Last filled 06/19/2019 Last OV 07/30/2019  Ok to fill?

## 2020-03-13 ENCOUNTER — Encounter: Payer: Self-pay | Admitting: Family Medicine

## 2020-03-13 ENCOUNTER — Other Ambulatory Visit: Payer: Self-pay

## 2020-03-13 ENCOUNTER — Ambulatory Visit (INDEPENDENT_AMBULATORY_CARE_PROVIDER_SITE_OTHER): Payer: 59 | Admitting: Family Medicine

## 2020-03-13 VITALS — BP 100/80 | HR 73 | Temp 98.3°F | Ht 67.0 in | Wt 146.0 lb

## 2020-03-13 DIAGNOSIS — M76891 Other specified enthesopathies of right lower limb, excluding foot: Secondary | ICD-10-CM

## 2020-03-13 DIAGNOSIS — R590 Localized enlarged lymph nodes: Secondary | ICD-10-CM | POA: Diagnosis not present

## 2020-03-13 LAB — BASIC METABOLIC PANEL
BUN: 10 mg/dL (ref 7–25)
CO2: 29 mmol/L (ref 20–32)
Calcium: 9.7 mg/dL (ref 8.6–10.4)
Chloride: 103 mmol/L (ref 98–110)
Creat: 0.79 mg/dL (ref 0.50–0.99)
Glucose, Bld: 99 mg/dL (ref 65–99)
Potassium: 4 mmol/L (ref 3.5–5.3)
Sodium: 139 mmol/L (ref 135–146)

## 2020-03-13 LAB — CBC WITH DIFFERENTIAL/PLATELET
Absolute Monocytes: 391 cells/uL (ref 200–950)
Basophils Absolute: 37 cells/uL (ref 0–200)
Basophils Relative: 0.6 %
Eosinophils Absolute: 31 cells/uL (ref 15–500)
Eosinophils Relative: 0.5 %
HCT: 40.8 % (ref 35.0–45.0)
Hemoglobin: 13.7 g/dL (ref 11.7–15.5)
Lymphs Abs: 1234 cells/uL (ref 850–3900)
MCH: 30.7 pg (ref 27.0–33.0)
MCHC: 33.6 g/dL (ref 32.0–36.0)
MCV: 91.5 fL (ref 80.0–100.0)
MPV: 10.3 fL (ref 7.5–12.5)
Monocytes Relative: 6.3 %
Neutro Abs: 4507 cells/uL (ref 1500–7800)
Neutrophils Relative %: 72.7 %
Platelets: 242 10*3/uL (ref 140–400)
RBC: 4.46 10*6/uL (ref 3.80–5.10)
RDW: 12 % (ref 11.0–15.0)
Total Lymphocyte: 19.9 %
WBC: 6.2 10*3/uL (ref 3.8–10.8)

## 2020-03-13 NOTE — Progress Notes (Signed)
   Subjective:    Patient ID: Kimberly Wheeler, female    DOB: 06/25/1955, 64 y.o.   MRN: [id number]  HPI Here to follow up on swollen nodes in her neck and for a new pain in the right groin. The neck nodes appeared over a year ago and they seem to come and go. No mouth problems, no ST. She saw Dr. Constance Holster for an ENT exam last month, and he felt they were likely to be benign. Kimberly Wheeler is quite anxious about these and she wants to make sure there are no hidden cancers, etc. No weight changes or fevers. Also one week ago she developed a sharp pain in the right groin. No recent trauma, but she did start an exercise cross fit class a few weeks ago.    Review of Systems  Constitutional: Negative.   HENT: Negative.   Eyes: Negative.   Respiratory: Negative.   Cardiovascular: Negative.   Musculoskeletal: Positive for myalgias.  Hematological: Positive for adenopathy.       Objective:   Physical Exam Constitutional:      Appearance: Normal appearance. She is not ill-appearing.  HENT:     Right Ear: Tympanic membrane, ear canal and external ear normal.     Left Ear: Tympanic membrane, ear canal and external ear normal.     Nose: Nose normal.     Mouth/Throat:     Pharynx: Oropharynx is clear.  Eyes:     Conjunctiva/sclera: Conjunctivae normal.  Neck:     Comments: There is a single 1 cm mobile non-tender node in the right anterior neck and another one just like it in the left anterior neck  Cardiovascular:     Rate and Rhythm: Normal rate and regular rhythm.     Pulses: Normal pulses.     Heart sounds: Normal heart sounds.  Pulmonary:     Effort: Pulmonary effort is normal.     Breath sounds: Normal breath sounds.  Musculoskeletal:     Comments: She is tender over the insertion of the right hip flexor tendon onto the pelvis. No swelling or lumps are felt   Neurological:     Mental Status: She is alert.           Assessment & Plan:  She has a hip flexor strain. Rest and time will heal  this. She can apply ice as needed. For the neck nodes, we will set up CT scans of the head and neck with and without contrast.  Kimberly Penna, MD

## 2020-05-26 ENCOUNTER — Encounter: Payer: Self-pay | Admitting: Nurse Practitioner

## 2020-05-26 ENCOUNTER — Other Ambulatory Visit: Payer: Self-pay

## 2020-05-26 ENCOUNTER — Ambulatory Visit (INDEPENDENT_AMBULATORY_CARE_PROVIDER_SITE_OTHER): Payer: 59 | Admitting: Nurse Practitioner

## 2020-05-26 VITALS — BP 116/70 | HR 68 | Resp 16 | Wt 147.0 lb

## 2020-05-26 DIAGNOSIS — N9089 Other specified noninflammatory disorders of vulva and perineum: Secondary | ICD-10-CM

## 2020-05-26 DIAGNOSIS — L989 Disorder of the skin and subcutaneous tissue, unspecified: Secondary | ICD-10-CM | POA: Diagnosis not present

## 2020-05-26 DIAGNOSIS — R300 Dysuria: Secondary | ICD-10-CM

## 2020-05-26 DIAGNOSIS — N390 Urinary tract infection, site not specified: Secondary | ICD-10-CM | POA: Diagnosis not present

## 2020-05-26 LAB — POCT URINALYSIS DIPSTICK
Bilirubin, UA: NEGATIVE
Blood, UA: NEGATIVE
Glucose, UA: NEGATIVE
Ketones, UA: NEGATIVE
Nitrite, UA: NEGATIVE
Protein, UA: NEGATIVE
Urobilinogen, UA: NEGATIVE E.U./dL — AB
pH, UA: 5 (ref 5.0–8.0)

## 2020-05-26 MED ORDER — ESTRADIOL 0.1 MG/GM VA CREA
TOPICAL_CREAM | VAGINAL | 1 refills | Status: DC
Start: 2020-05-26 — End: 2021-12-27

## 2020-05-26 NOTE — Progress Notes (Signed)
GYNECOLOGY  VISIT  CC:  Vulvar irritation, dysuria  HPI: 64 y.o. G40P2002 Separated White or Caucasian female here for uti/vaginitis. poct urine-wbc tr  Is very irritated, not sure where it is coming from, using monistat x 3 days with out relief. Had sex this past weekend for the first time in a while. Was on vaginal estrogen a couple years ago, but stopped using when wasn't sexually active. Would like to re-start.  Has had a lot of problems with bladder infections in the past. Also feels pelvic pressure, AZO home test showed + leukocytes. Today advised that trace leukocytes and maybe contamination. Pt desires to send a urine culture since she has significant history.    GYNECOLOGIC HISTORY: Patient's last menstrual period was 03/07/2011. Contraception: BTL Menopausal hormone therapy: none  Patient Active Problem List   Diagnosis Date Noted   Lesion of bladder    Fibroid    Chronic UTI (urinary tract infection)    Arthritis    SVT (supraventricular tachycardia) ([place]) 05/24/2017   Syncope 03/07/2017   Intramural leiomyoma of uterus 11/06/2014   Submucous leiomyoma of uterus 11/06/2014   Subserous leiomyoma of uterus 11/06/2014   MITRAL VALVE PROLAPSE, HX OF 06/26/2007   UTI'S, HX OF 06/26/2007   CHICKENPOX, HX OF 06/26/2007    Past Medical History:  Diagnosis Date   Arthritis    fingers   Chronic UTI (urinary tract infection)    Fibroid    Injury of fifth finger of right hand    Lesion of bladder    SVT (supraventricular tachycardia) ([place])     Past Surgical History:  Procedure Laterality Date   CESAREAN SECTION  1986   CESAREAN SECTION W/BTL  1988   COLONOSCOPY  01/22/2018    per Dr. Collene Mares, benign polyp, repeat in 10 yrs    CYSTOSCOPY W/ RETROGRADES  06/15/2011   Procedure: CYSTOSCOPY WITH RETROGRADE PYELOGRAM;  Surgeon: Molli Hazard, MD;  Location: [facility];  Service: Urology;  Laterality: Bilateral;  (BIL) RPG    CYSTOSCOPY WITH BIOPSY  06/15/2011   Procedure: CYSTOSCOPY WITH BIOPSY;  Surgeon: Molli Hazard, MD;  Location: [facility];  Service: Urology;  Laterality: N/A;  BLADDER BIOPSY C-ARM CAMERA     LOOP RECORDER INSERTION N/A 04/06/2017   Procedure: LOOP RECORDER INSERTION;  Surgeon: Evans Lance, MD;  Location: [place] [facility];  Service: Cardiovascular;  Laterality: N/A;   SALPINGOOPHORECTOMY  1991   LEFT   SVT ABLATION N/A 05/24/2017   Procedure: SVT ABLATION;  Surgeon: Evans Lance, MD;  Location: [place] [facility];  Service: Cardiovascular;  Laterality: N/A;    MEDS:   Current Outpatient Medications on File Prior to Visit  Medication Sig Dispense Refill   Cyanocobalamin (B-12 PO) Take by mouth.     LORazepam (ATIVAN) 0.5 MG tablet TAKE 1 TABLET BY MOUTH EVERY 6 HOURS AS NEEDED FOR ANXIETY 60 tablet 5   No current facility-administered medications on file prior to visit.    ALLERGIES: Wheat bran, Darvon, Macrodantin [nitrofurantoin], and Propoxyphene  Family History  Problem Relation Age of Onset   Osteoporosis Mother    Prostate cancer Father    Diabetes Maternal Grandmother    Heart attack Maternal Grandmother    Breast cancer Maternal Aunt      Review of Systems  Constitutional: Negative.   HENT: Negative.   Eyes: Negative.   Respiratory: Negative.   Cardiovascular: Negative.   Gastrointestinal: Negative.   Endocrine: Negative.  Genitourinary: Positive for frequency.       Pressure, vaginal burning, vaginal discharge  Musculoskeletal: Negative.   Skin: Negative.   Allergic/Immunologic: Negative.   Neurological: Negative.   Hematological: Negative.   Psychiatric/Behavioral: Negative.     PHYSICAL EXAMINATION:    BP 116/70    Pulse 68    Resp 16    Wt 147 lb (66.7 kg)    LMP 03/07/2011    BMI 23.02 kg/m      General appearance: alert, cooperative, no acute distress  Lymph:  no inguinal LAD noted  Pelvic: External  genitalia:  Small (2-3 mm red lesion) appears as small ulceration, no bleeding, moist              Urethra:  normal appearing urethra with no masses, tenderness or lesions              Bartholins and Skenes: normal                 Vagina: difficult to assess discharge R/T moderate amount monistat in vagina, redness noted               Cervix: no cervical motion tenderness and redness              Bimanual Exam:  Uterus:  normal size, contour, position, consistency, mobility, non-tender              Adnexa: no mass, fullness, tenderness and left ovary surgically absent               Chaperone, Amanda, CMA, was present for exam.  Assessment: Atypical lesion of vestibule Dysuria Vulvar irriation Vaginal atrophy  Plan: Testing done today: urine culture, HSV NAAT, Affirm, GC/CT, and urinalysis Recommend to keep vulvar sore clean and dry, will not treat as HSV today (no lymphadenopathy, low suspicion) Will treat vaginal atrophy with refill of estrace cream Will notify of lab results and treat if indicated as appropriate. F/U as scheduled for annual exam   30 minutes of total time was spent for this patient encounter, including preparation, face-to-face counseling with the patient and coordination of care, and documentation of the encounter.

## 2020-05-26 NOTE — Patient Instructions (Addendum)
Testing done today: urine culture, HSV NAAT, Affirm, GC/CT, and urinalysis Recommend to keep vulvar sore clean and dry, will not treat as HSV today (no lymphadenopathy, low suspicion) Will treat vaginal atrophy with refill of estrace cream Will notify of lab results and treat if indicated as appropriate.

## 2020-05-28 LAB — GC/CHLAMYDIA PROBE AMP
Chlamydia trachomatis, NAA: NEGATIVE
Neisseria Gonorrhoeae by PCR: NEGATIVE

## 2020-05-28 LAB — URINE CULTURE: Organism ID, Bacteria: NO GROWTH

## 2020-05-29 LAB — VAGINITIS/VAGINOSIS, DNA PROBE
Candida Species: NEGATIVE
Gardnerella vaginalis: NEGATIVE
Trichomonas vaginosis: NEGATIVE

## 2020-06-01 LAB — HSV NAA
HSV 1 NAA: POSITIVE — AB
HSV 2 NAA: NEGATIVE

## 2020-07-03 ENCOUNTER — Other Ambulatory Visit: Payer: Self-pay | Admitting: Obstetrics & Gynecology

## 2020-07-03 DIAGNOSIS — Z1231 Encounter for screening mammogram for malignant neoplasm of breast: Secondary | ICD-10-CM

## 2020-07-08 ENCOUNTER — Ambulatory Visit
Admission: RE | Admit: 2020-07-08 | Discharge: 2020-07-08 | Disposition: A | Discharge status: Home / Self Care | Payer: No Typology Code available for payment source | Source: Ambulatory Visit | Attending: Obstetrics & Gynecology | Admitting: Obstetrics & Gynecology

## 2020-07-08 ENCOUNTER — Other Ambulatory Visit: Payer: Self-pay

## 2020-07-08 DIAGNOSIS — Z1231 Encounter for screening mammogram for malignant neoplasm of breast: Secondary | ICD-10-CM

## 2020-10-27 ENCOUNTER — Telehealth: Payer: Self-pay | Admitting: *Deleted

## 2020-10-27 NOTE — Telephone Encounter (Signed)
Patient called requesting refill on estradiol 0.1 vaginal cream, patient is over due for annual exam. Message sent to appointments to schedule.

## 2020-11-29 ENCOUNTER — Telehealth: Payer: No Typology Code available for payment source | Admitting: Physician Assistant

## 2020-11-29 ENCOUNTER — Encounter: Payer: Self-pay | Admitting: Physician Assistant

## 2020-11-29 DIAGNOSIS — N390 Urinary tract infection, site not specified: Secondary | ICD-10-CM

## 2020-11-29 NOTE — Progress Notes (Signed)
Based on what you shared with me, I feel your condition warrants further evaluation and I recommend that you be seen in a face to face visit.   NOTE: There will be NO CHARGE for this eVisit  [name],  Sorry you arent feeling well. History of recurrent UTIs- it appears you may have been treated for similar symptoms in the past 7 months or so,  and any UTI in your age group would warrant a face to face visit to ensure that a urine culture is obtained to make sure the bacteria is known and is targeted with the right antibiotic. I would suggest that you have a face to face evaluation for your symptoms.    If you are having a true medical emergency please call 911.      For an urgent face to face visit, [facility] has six urgent care centers for your convenience:     [facility] [place] [facility] [place] Get Driving Directions [phone] [address] [place], [place] [address]    [facility] [place] [facility]) Get Driving Directions [phone] [place], [place] [address]  [facility] [place] ([place]) Get Driving Directions [phone] [address] [place],  [place]  [address]  [facility] Get Driving Directions [phone] [address] [place], [place], [place] [address]   [facility] Get Driving Directions  [phone] [address] [place], [place] [address]   [facility] [place] Get Driving Directions [phone] [address] [place], [place] [address]  Your MyChart E-visit questionnaire answers were reviewed by a board certified advanced clinical practitioner to complete your personal care plan based on your specific symptoms.  Thank you for using e-Visits.   I spent 5-10 minutes on review and completion of this note- Lacy Duverney [facility]

## 2020-12-02 ENCOUNTER — Other Ambulatory Visit: Payer: Self-pay | Admitting: Orthopaedic Surgery

## 2020-12-03 ENCOUNTER — Encounter (HOSPITAL_BASED_OUTPATIENT_CLINIC_OR_DEPARTMENT_OTHER): Payer: Self-pay | Admitting: Orthopaedic Surgery

## 2020-12-03 ENCOUNTER — Other Ambulatory Visit: Payer: Self-pay

## 2020-12-04 ENCOUNTER — Encounter (HOSPITAL_BASED_OUTPATIENT_CLINIC_OR_DEPARTMENT_OTHER)
Admission: RE | Admit: 2020-12-04 | Discharge: 2020-12-04 | Disposition: A | Discharge status: Home / Self Care | Payer: 59 | Source: Ambulatory Visit | Attending: Orthopedic Surgery | Admitting: Orthopedic Surgery

## 2020-12-04 DIAGNOSIS — Z833 Family history of diabetes mellitus: Secondary | ICD-10-CM | POA: Diagnosis not present

## 2020-12-04 DIAGNOSIS — Z8249 Family history of ischemic heart disease and other diseases of the circulatory system: Secondary | ICD-10-CM | POA: Diagnosis not present

## 2020-12-04 DIAGNOSIS — X58XXXA Exposure to other specified factors, initial encounter: Secondary | ICD-10-CM | POA: Diagnosis not present

## 2020-12-04 DIAGNOSIS — Z881 Allergy status to other antibiotic agents status: Secondary | ICD-10-CM | POA: Diagnosis not present

## 2020-12-04 DIAGNOSIS — Z8042 Family history of malignant neoplasm of prostate: Secondary | ICD-10-CM | POA: Diagnosis not present

## 2020-12-04 DIAGNOSIS — Z803 Family history of malignant neoplasm of breast: Secondary | ICD-10-CM | POA: Diagnosis not present

## 2020-12-04 DIAGNOSIS — Z79899 Other long term (current) drug therapy: Secondary | ICD-10-CM | POA: Diagnosis not present

## 2020-12-04 DIAGNOSIS — Z8262 Family history of osteoporosis: Secondary | ICD-10-CM | POA: Diagnosis not present

## 2020-12-04 DIAGNOSIS — Z91018 Allergy to other foods: Secondary | ICD-10-CM | POA: Diagnosis not present

## 2020-12-04 DIAGNOSIS — W228XXA Striking against or struck by other objects, initial encounter: Secondary | ICD-10-CM | POA: Diagnosis not present

## 2020-12-04 DIAGNOSIS — S92422A Displaced fracture of distal phalanx of left great toe, initial encounter for closed fracture: Secondary | ICD-10-CM | POA: Diagnosis not present

## 2020-12-04 DIAGNOSIS — Z885 Allergy status to narcotic agent status: Secondary | ICD-10-CM | POA: Diagnosis not present

## 2020-12-04 NOTE — Progress Notes (Signed)

## 2020-12-08 ENCOUNTER — Encounter (HOSPITAL_BASED_OUTPATIENT_CLINIC_OR_DEPARTMENT_OTHER)
Admission: RE | Disposition: A | Discharge status: Home / Self Care | Payer: Self-pay | Source: Home / Self Care | Attending: Orthopaedic Surgery

## 2020-12-08 ENCOUNTER — Ambulatory Visit (HOSPITAL_BASED_OUTPATIENT_CLINIC_OR_DEPARTMENT_OTHER): Payer: 59 | Admitting: Anesthesiology

## 2020-12-08 ENCOUNTER — Ambulatory Visit (HOSPITAL_BASED_OUTPATIENT_CLINIC_OR_DEPARTMENT_OTHER)
Admission: RE | Admit: 2020-12-08 | Discharge: 2020-12-08 | Disposition: A | Discharge status: Home / Self Care | Payer: 59 | Attending: Orthopaedic Surgery | Admitting: Orthopaedic Surgery

## 2020-12-08 ENCOUNTER — Other Ambulatory Visit: Payer: Self-pay

## 2020-12-08 ENCOUNTER — Encounter (HOSPITAL_BASED_OUTPATIENT_CLINIC_OR_DEPARTMENT_OTHER): Payer: Self-pay | Admitting: Orthopaedic Surgery

## 2020-12-08 DIAGNOSIS — Z91018 Allergy to other foods: Secondary | ICD-10-CM | POA: Diagnosis not present

## 2020-12-08 DIAGNOSIS — W228XXA Striking against or struck by other objects, initial encounter: Secondary | ICD-10-CM | POA: Insufficient documentation

## 2020-12-08 DIAGNOSIS — Z8042 Family history of malignant neoplasm of prostate: Secondary | ICD-10-CM | POA: Insufficient documentation

## 2020-12-08 DIAGNOSIS — Z803 Family history of malignant neoplasm of breast: Secondary | ICD-10-CM | POA: Insufficient documentation

## 2020-12-08 DIAGNOSIS — Z8249 Family history of ischemic heart disease and other diseases of the circulatory system: Secondary | ICD-10-CM | POA: Insufficient documentation

## 2020-12-08 DIAGNOSIS — Z881 Allergy status to other antibiotic agents status: Secondary | ICD-10-CM | POA: Diagnosis not present

## 2020-12-08 DIAGNOSIS — Z8262 Family history of osteoporosis: Secondary | ICD-10-CM | POA: Insufficient documentation

## 2020-12-08 DIAGNOSIS — S92422A Displaced fracture of distal phalanx of left great toe, initial encounter for closed fracture: Secondary | ICD-10-CM | POA: Insufficient documentation

## 2020-12-08 DIAGNOSIS — Z885 Allergy status to narcotic agent status: Secondary | ICD-10-CM | POA: Diagnosis not present

## 2020-12-08 DIAGNOSIS — X58XXXA Exposure to other specified factors, initial encounter: Secondary | ICD-10-CM | POA: Insufficient documentation

## 2020-12-08 DIAGNOSIS — Z79899 Other long term (current) drug therapy: Secondary | ICD-10-CM | POA: Insufficient documentation

## 2020-12-08 DIAGNOSIS — Z833 Family history of diabetes mellitus: Secondary | ICD-10-CM | POA: Insufficient documentation

## 2020-12-08 HISTORY — PX: OPEN REDUCTION INTERNAL FIXATION (ORIF) DISTAL PHALANX: SHX6236

## 2020-12-08 SURGERY — OPEN REDUCTION INTERNAL FIXATION (ORIF) DISTAL PHALANX
Anesthesia: General | Laterality: Left

## 2020-12-08 MED ORDER — PROMETHAZINE HCL 25 MG/ML IJ SOLN: 6.2500 mg | INTRAMUSCULAR | Status: DC | PRN

## 2020-12-08 MED ORDER — OXYCODONE HCL 5 MG/5ML PO SOLN: 5.0000 mg | Freq: Once | ORAL | Status: DC | PRN

## 2020-12-08 MED ORDER — ONDANSETRON HCL 4 MG/2ML IJ SOLN
INTRAMUSCULAR | Status: DC | PRN
  Administered 2020-12-08: 4 mg via INTRAVENOUS

## 2020-12-08 MED ORDER — PHENYLEPHRINE 40 MCG/ML (10ML) SYRINGE FOR IV PUSH (FOR BLOOD PRESSURE SUPPORT)
PREFILLED_SYRINGE | INTRAVENOUS | Status: AC
  Filled 2020-12-08: qty 10

## 2020-12-08 MED ORDER — DROPERIDOL 2.5 MG/ML IJ SOLN: 0.6250 mg | Freq: Once | INTRAMUSCULAR | Status: DC | PRN

## 2020-12-08 MED ORDER — PROPOFOL 10 MG/ML IV BOLUS
INTRAVENOUS | Status: DC | PRN
  Administered 2020-12-08: 150 mg via INTRAVENOUS

## 2020-12-08 MED ORDER — PHENYLEPHRINE HCL (PRESSORS) 10 MG/ML IV SOLN
INTRAVENOUS | Status: DC | PRN
  Administered 2020-12-08 (×3): 40 ug via INTRAVENOUS

## 2020-12-08 MED ORDER — FENTANYL CITRATE (PF) 100 MCG/2ML IJ SOLN
25.0000 ug | INTRAMUSCULAR | Status: DC | PRN
  Administered 2020-12-08 (×2): 50 ug via INTRAVENOUS

## 2020-12-08 MED ORDER — MIDAZOLAM HCL 2 MG/2ML IJ SOLN
INTRAMUSCULAR | Status: AC
  Filled 2020-12-08: qty 2

## 2020-12-08 MED ORDER — FENTANYL CITRATE (PF) 100 MCG/2ML IJ SOLN
INTRAMUSCULAR | Status: AC
  Filled 2020-12-08: qty 2

## 2020-12-08 MED ORDER — FENTANYL CITRATE (PF) 100 MCG/2ML IJ SOLN
INTRAMUSCULAR | Status: DC | PRN
  Administered 2020-12-08 (×4): 25 ug via INTRAVENOUS

## 2020-12-08 MED ORDER — OXYCODONE HCL 5 MG PO TABS: 5.0000 mg | ORAL_TABLET | Freq: Once | ORAL | Status: DC | PRN

## 2020-12-08 MED ORDER — EPHEDRINE 5 MG/ML INJ
INTRAVENOUS | Status: AC
  Filled 2020-12-08: qty 10

## 2020-12-08 MED ORDER — LIDOCAINE HCL (CARDIAC) PF 100 MG/5ML IV SOSY
PREFILLED_SYRINGE | INTRAVENOUS | Status: DC | PRN
  Administered 2020-12-08: 80 mg via INTRAVENOUS

## 2020-12-08 MED ORDER — OXYCODONE HCL 5 MG PO TABS: 5.0000 mg | ORAL_TABLET | ORAL | 0 refills | Status: AC | PRN

## 2020-12-08 MED ORDER — LACTATED RINGERS IV SOLN: INTRAVENOUS | Status: DC

## 2020-12-08 MED ORDER — DEXAMETHASONE SODIUM PHOSPHATE 10 MG/ML IJ SOLN
INTRAMUSCULAR | Status: DC | PRN
  Administered 2020-12-08: 5 mg via INTRAVENOUS

## 2020-12-08 MED ORDER — EPHEDRINE SULFATE 50 MG/ML IJ SOLN
INTRAMUSCULAR | Status: DC | PRN
  Administered 2020-12-08: 5 mg via INTRAVENOUS
  Administered 2020-12-08 (×2): 10 mg via INTRAVENOUS
  Administered 2020-12-08: 5 mg via INTRAVENOUS

## 2020-12-08 MED ORDER — MIDAZOLAM HCL 5 MG/5ML IJ SOLN
INTRAMUSCULAR | Status: DC | PRN
  Administered 2020-12-08: 2 mg via INTRAVENOUS

## 2020-12-08 MED ORDER — BUPIVACAINE HCL 0.5 % IJ SOLN
INTRAMUSCULAR | Status: DC | PRN
  Administered 2020-12-08: 10 mL

## 2020-12-08 MED ORDER — CEFAZOLIN SODIUM-DEXTROSE 2-4 GM/100ML-% IV SOLN
2.0000 g | INTRAVENOUS | Status: AC
  Administered 2020-12-08: 2 g via INTRAVENOUS

## 2020-12-08 MED ORDER — CEFAZOLIN SODIUM-DEXTROSE 2-4 GM/100ML-% IV SOLN
INTRAVENOUS | Status: AC
  Filled 2020-12-08: qty 100

## 2020-12-08 SURGICAL SUPPLY — 73 items
APL PRP STRL LF DISP 70% ISPRP (MISCELLANEOUS)
APL SKNCLS STERI-STRIP NONHPOA (GAUZE/BANDAGES/DRESSINGS)
BANDAGE ESMARK 6X9 LF (GAUZE/BANDAGES/DRESSINGS) IMPLANT
BENZOIN TINCTURE PRP APPL 2/3 (GAUZE/BANDAGES/DRESSINGS) IMPLANT
BIT DRILL CALIBR MINI 1.2 (BIT) ×2 IMPLANT
BLADE SURG 15 STRL LF DISP TIS (BLADE) ×2 IMPLANT
BLADE SURG 15 STRL SS (BLADE) ×9
BNDG CMPR 9X4 STRL LF SNTH (GAUZE/BANDAGES/DRESSINGS) ×1
BNDG CMPR 9X6 STRL LF SNTH (GAUZE/BANDAGES/DRESSINGS)
BNDG COHESIVE 4X5 TAN STRL (GAUZE/BANDAGES/DRESSINGS) ×2 IMPLANT
BNDG ELASTIC 4X5.8 VLCR STR LF (GAUZE/BANDAGES/DRESSINGS) ×2 IMPLANT
BNDG ELASTIC 6X5.8 VLCR STR LF (GAUZE/BANDAGES/DRESSINGS) ×4 IMPLANT
BNDG ESMARK 4X9 LF (GAUZE/BANDAGES/DRESSINGS) ×3 IMPLANT
BNDG ESMARK 6X9 LF (GAUZE/BANDAGES/DRESSINGS)
CHLORAPREP W/TINT 26 (MISCELLANEOUS) ×1 IMPLANT
CLOSURE WOUND 1/2 X4 (GAUZE/BANDAGES/DRESSINGS)
COVER BACK TABLE 60X90IN (DRAPES) ×3 IMPLANT
CUFF TOURN SGL QUICK 34 (TOURNIQUET CUFF)
CUFF TRNQT CYL 34X4.125X (TOURNIQUET CUFF) IMPLANT
DECANTER SPIKE VIAL GLASS SM (MISCELLANEOUS) ×2 IMPLANT
DRAPE C-ARMOR (DRAPES) IMPLANT
DRAPE EXTREMITY T 121X128X90 (DISPOSABLE) ×3 IMPLANT
DRAPE IMP U-DRAPE 54X76 (DRAPES) ×3 IMPLANT
DRAPE OEC MINIVIEW 54X84 (DRAPES) ×3 IMPLANT
DRAPE U-SHAPE 47X51 STRL (DRAPES) ×3 IMPLANT
DURAPREP 26ML APPLICATOR (WOUND CARE) ×2 IMPLANT
ELECT REM PT RETURN 9FT ADLT (ELECTROSURGICAL) ×3
ELECTRODE REM PT RTRN 9FT ADLT (ELECTROSURGICAL) ×1 IMPLANT
GAUZE SPONGE 4X4 12PLY STRL (GAUZE/BANDAGES/DRESSINGS) ×3 IMPLANT
GAUZE XEROFORM 1X8 LF (GAUZE/BANDAGES/DRESSINGS) ×3 IMPLANT
GLOVE SRG 8 PF TXTR STRL LF DI (GLOVE) ×1 IMPLANT
GLOVE SURG ENC TEXT LTX SZ7.5 (GLOVE) ×3 IMPLANT
GLOVE SURG POLYISO LF SZ7 (GLOVE) ×2 IMPLANT
GLOVE SURG UNDER POLY LF SZ7 (GLOVE) ×2 IMPLANT
GLOVE SURG UNDER POLY LF SZ8 (GLOVE) ×3
GOWN STRL REUS W/ TWL LRG LVL3 (GOWN DISPOSABLE) ×1 IMPLANT
GOWN STRL REUS W/ TWL XL LVL3 (GOWN DISPOSABLE) ×1 IMPLANT
GOWN STRL REUS W/TWL LRG LVL3 (GOWN DISPOSABLE) ×3
GOWN STRL REUS W/TWL XL LVL3 (GOWN DISPOSABLE) ×2 IMPLANT
GUIDEWIRE .045XTROC TIP LSR LN (WIRE) IMPLANT
GUIDEWIRE 0.86MM (WIRE) ×2 IMPLANT
GUIDEWIRE TROCAR 1.6X71 (ORTHOPEDIC DISPOSABLE SUPPLIES) ×2 IMPLANT
K-WIRE 1.1 (WIRE) ×3
NDL SAFETY ECLIPSE 18X1.5 (NEEDLE) ×1 IMPLANT
NEEDLE HYPO 18GX1.5 SHARP (NEEDLE)
NS IRRIG 1000ML POUR BTL (IV SOLUTION) ×3 IMPLANT
PACK BASIN DAY SURGERY FS (CUSTOM PROCEDURE TRAY) ×3 IMPLANT
PAD CAST 4YDX4 CTTN HI CHSV (CAST SUPPLIES) ×1 IMPLANT
PADDING CAST COTTON 4X4 STRL (CAST SUPPLIES) ×3
PADDING CAST SYNTHETIC 4 (CAST SUPPLIES) ×2
PADDING CAST SYNTHETIC 4X4 STR (CAST SUPPLIES) ×1 IMPLANT
PENCIL SMOKE EVACUATOR (MISCELLANEOUS) ×3 IMPLANT
SCREW CORT LP 1.6X10 (Screw) ×2 IMPLANT
SHEET MEDIUM DRAPE 40X70 STRL (DRAPES) ×3 IMPLANT
SLEEVE SCD COMPRESS KNEE MED (STOCKING) ×3 IMPLANT
SPLINT FIBERGLASS 4X30 (CAST SUPPLIES) ×3 IMPLANT
SPONGE T-LAP 18X18 ~~LOC~~+RFID (SPONGE) ×2 IMPLANT
STOCKINETTE 6  STRL (DRAPES) ×3
STOCKINETTE 6 STRL (DRAPES) ×1 IMPLANT
STRIP CLOSURE SKIN 1/2X4 (GAUZE/BANDAGES/DRESSINGS) IMPLANT
SUCTION FRAZIER HANDLE 10FR (MISCELLANEOUS) ×3
SUCTION TUBE FRAZIER 10FR DISP (MISCELLANEOUS) ×1 IMPLANT
SUT ETHILON 3 0 PS 1 (SUTURE) ×3 IMPLANT
SUT FIBERWIRE 2-0 18 17.9 3/8 (SUTURE)
SUT MNCRL AB 3-0 PS2 18 (SUTURE) ×3 IMPLANT
SUT PDS AB 2-0 CT2 27 (SUTURE) ×3 IMPLANT
SUT VIC AB 3-0 FS2 27 (SUTURE) IMPLANT
SUTURE FIBERWR 2-0 18 17.9 3/8 (SUTURE) IMPLANT
SYR 10ML LL (SYRINGE) ×3 IMPLANT
SYR BULB EAR ULCER 3OZ GRN STR (SYRINGE) ×3 IMPLANT
TOWEL GREEN STERILE FF (TOWEL DISPOSABLE) ×6 IMPLANT
TUBE CONNECTING 20'X1/4 (TUBING) ×1
TUBE CONNECTING 20X1/4 (TUBING) ×2 IMPLANT

## 2020-12-08 NOTE — Anesthesia Preprocedure Evaluation (Addendum)
Anesthesia Evaluation  Patient identified by MRN, date of birth, ID band Patient awake    Reviewed: Allergy & Precautions, NPO status , Patient's Chart, lab work & pertinent test results  Airway Mallampati: I  TM Distance: >3 FB Neck ROM: Full    Dental no notable dental hx.    Pulmonary neg pulmonary ROS,    Pulmonary exam normal breath sounds clear to auscultation       Cardiovascular Exercise Tolerance: Good Normal cardiovascular exam+ dysrhythmias Supra Ventricular Tachycardia + Valvular Problems/Murmurs MVP  Rhythm:Regular Rate:Normal     Neuro/Psych negative neurological ROS  negative psych ROS   GI/Hepatic negative GI ROS, Neg liver ROS,   Endo/Other  negative endocrine ROS  Renal/GU negative Renal ROS   Recurrent UTIs    Musculoskeletal  (+) Arthritis , Osteoarthritis,    Abdominal   Peds negative pediatric ROS (+)  Hematology negative hematology ROS (+)   Anesthesia Other Findings   Reproductive/Obstetrics negative OB ROS                            Anesthesia Physical Anesthesia Plan  ASA: 2  Anesthesia Plan: General   Post-op Pain Management:    Induction: Intravenous  PONV Risk Score and Plan: 3 and Treatment may vary due to age or medical condition, Midazolam, Ondansetron and Dexamethasone  Airway Management Planned: LMA  Additional Equipment:   Intra-op Plan:   Post-operative Plan: Extubation in OR  Informed Consent: I have reviewed the patients History and Physical, chart, labs and discussed the procedure including the risks, benefits and alternatives for the proposed anesthesia with the patient or authorized representative who has indicated his/her understanding and acceptance.     Dental advisory given  Plan Discussed with: CRNA and Anesthesiologist  Anesthesia Plan Comments:        Anesthesia Quick Evaluation

## 2020-12-08 NOTE — Anesthesia Procedure Notes (Signed)
Procedure Name: LMA Insertion Date/Time: 12/08/2020 8:34 AM Performed by: Lavonia Dana, CRNA Pre-anesthesia Checklist: Patient identified, Emergency Drugs available, Suction available and Patient being monitored Patient Re-evaluated:Patient Re-evaluated prior to induction Oxygen Delivery Method: Circle system utilized Preoxygenation: Pre-oxygenation with 100% oxygen Induction Type: IV induction Ventilation: Mask ventilation without difficulty LMA: LMA inserted LMA Size: 4.0 Number of attempts: 1 Airway Equipment and Method: Bite block Placement Confirmation: positive ETCO2 Tube secured with: Tape Dental Injury: Teeth and Oropharynx as per pre-operative assessment

## 2020-12-08 NOTE — Transfer of Care (Signed)
Immediate Anesthesia Transfer of Care Note  Patient: Kimberly Wheeler  Procedure(s) Performed: OPEN TREATMENT OF LEFT HALLUX DISTAL PHALANX FRACTURE (Left)  Patient Location: PACU  Anesthesia Type:General  Level of Consciousness: drowsy  Airway & Oxygen Therapy: Patient Spontanous Breathing and Patient connected to face mask oxygen  Post-op Assessment: Report given to RN and Post -op Vital signs reviewed and stable  Post vital signs: Reviewed and stable  Last Vitals:  Vitals Value Taken Time  BP 114/50 12/08/20 0928  Temp    Pulse 93 12/08/20 0930  Resp 13 12/08/20 0930  SpO2 100 % 12/08/20 0930  Vitals shown include unvalidated device data.  Last Pain:  Vitals:   12/08/20 0750  TempSrc: Oral  PainSc: 2       Patients Stated Pain Goal: 5 (55/97/41 [id number])  Complications: No notable events documented.

## 2020-12-08 NOTE — Anesthesia Postprocedure Evaluation (Signed)
Anesthesia Post Note  Patient: Marica M Welliver  Procedure(s) Performed: OPEN TREATMENT OF LEFT HALLUX DISTAL PHALANX FRACTURE (Left)     Patient location during evaluation: PACU Anesthesia Type: General Level of consciousness: awake Pain management: pain level controlled Vital Signs Assessment: post-procedure vital signs reviewed and stable Respiratory status: spontaneous breathing and respiratory function stable Cardiovascular status: stable Postop Assessment: no apparent nausea or vomiting Anesthetic complications: no   No notable events documented.  Last Vitals:  Vitals:   12/08/20 1000 12/08/20 1023  BP: 109/73 (!) 112/22  Pulse: 80 76  Resp: 19 17  Temp:  (!) 36.4 C  SpO2: 99% 96%    Last Pain:  Vitals:   12/08/20 1007  TempSrc:   PainSc: 0-No pain                 Merlinda Frederick

## 2020-12-08 NOTE — H&P (Signed)
PREOPERATIVE H&P  Chief Complaint: Left great toe pain  HPI: Kimberly Wheeler is a 65 y.o. female who presents for preoperative history and physical with a diagnosis of left hallux distal phalanx avulsion fracture of the extensor hallucis longus tendon.  She had displacement of the fracture and is here today for surgical intervention. Symptoms are rated as moderate to severe, and have been worsening.  This is significantly impairing activities of daily living.  She has elected for surgical management.   Past Medical History:  Diagnosis Date   Arthritis    fingers   Chronic UTI (urinary tract infection)    Fibroid    Injury of fifth finger of right hand    Lesion of bladder    SVT (supraventricular tachycardia) ([place])    Past Surgical History:  Procedure Laterality Date   CESAREAN SECTION  1986   CESAREAN SECTION W/BTL  1988   COLONOSCOPY  01/22/2018    per Dr. Collene Mares, benign polyp, repeat in 10 yrs    CYSTOSCOPY W/ RETROGRADES  06/15/2011   Procedure: CYSTOSCOPY WITH RETROGRADE PYELOGRAM;  Surgeon: Molli Hazard, MD;  Location: [facility];  Service: Urology;  Laterality: Bilateral;  (BIL) RPG   CYSTOSCOPY WITH BIOPSY  06/15/2011   Procedure: CYSTOSCOPY WITH BIOPSY;  Surgeon: Molli Hazard, MD;  Location: [facility];  Service: Urology;  Laterality: N/A;  BLADDER BIOPSY C-ARM CAMERA     LOOP RECORDER INSERTION N/A 04/06/2017   Procedure: LOOP RECORDER INSERTION;  Surgeon: Evans Lance, MD;  Location: [place] [facility];  Service: Cardiovascular;  Laterality: N/A;   SALPINGOOPHORECTOMY  1991   LEFT   SVT ABLATION N/A 05/24/2017   Procedure: SVT ABLATION;  Surgeon: Evans Lance, MD;  Location: [facility];  Service: Cardiovascular;  Laterality: N/A;   Social History   Socioeconomic History   Marital status: Legally Separated    Spouse name: Not on file   Number of children: 2   Years of education: Not on file   Highest  education level: Not on file  Occupational History    Comment: Real estate broker  Tobacco Use   Smoking status: Never   Smokeless tobacco: Never  Vaping Use   Vaping Use: Never used  Substance and Sexual Activity   Alcohol use: Yes    Comment: 3-5   Drug use: No   Sexual activity: Yes    Partners: Male    Birth control/protection: Surgical    Comment: Tubal Ligation  Other Topics Concern   Not on file  Social History Narrative   Not on file   Social Determinants of Health   Financial Resource Strain: Not on file  Food Insecurity: Not on file  Transportation Needs: Not on file  Physical Activity: Not on file  Stress: Not on file  Social Connections: Not on file   Family History  Problem Relation Age of Onset   Osteoporosis Mother    Prostate cancer Father    Diabetes Maternal Grandmother    Heart attack Maternal Grandmother    Breast cancer Maternal Aunt    Breast cancer Sister    Allergies  Allergen Reactions   Wheat Bran Itching and Rash   Darvon Nausea And Vomiting   Macrodantin [Nitrofurantoin] Nausea Only   Propoxyphene Nausea And Vomiting   Prior to Admission medications   Medication Sig Start Date End Date Taking? Authorizing Provider  ciprofloxacin (CIPRO) 500 MG tablet Take 500 mg by mouth  2 (two) times daily.   Yes [provider]  estradiol (ESTRACE) 0.1 MG/GM vaginal cream 1 gram vaginally twice weekly 05/26/20  Yes Karma Ganja, NP  Cyanocobalamin (B-12 PO) Take by mouth.    [provider]  LORazepam (ATIVAN) 0.5 MG tablet TAKE 1 TABLET BY MOUTH EVERY 6 HOURS AS NEEDED FOR ANXIETY 02/04/20   Laurey Morale, MD     Positive ROS: All other systems have been reviewed and were otherwise negative with the exception of those mentioned in the HPI and as above.  Physical Exam:  Vitals:   12/08/20 0750  BP: (!) 125/54  Pulse: 87  Resp: 20  Temp: 98.3 F (36.8 C)  SpO2: 99%   General: Alert, no acute distress Cardiovascular: No  pedal edema Respiratory: No cyanosis, no use of accessory musculature GI: No organomegaly, abdomen is soft and non-tender Skin: No lesions in the area of chief complaint Neurologic: Sensation intact distally Psychiatric: Patient is competent for consent with normal mood and affect Lymphatic: No axillary or cervical lymphadenopathy  MUSCULOSKELETAL: Left hallux with swelling dorsally at the level of the interphalangeal joint.  There is prominence there.  There is bruising and swelling about the forefoot.  She has tenderness to palpation in the area of the deformity.  She has no active hallux extension at the interphalangeal joint.  It is drooping.  She endorses sensation light touch globally about the dorsal and plantar forefoot.  Foot is warm well perfused with palpable dorsalis pedis pulse.  Assessment: Left hallux distal phalanx fracture with avulsion of the extensor houses longus tendon with displacement   Plan: Plan for open treatment of her fracture versus reconstruction of the tendon.  She understands she may end up with pin fixation of her hallux interphalangeal joint to hold the joint for healing purposes..  We discussed the risks, benefits and alternatives of surgery which include but are not limited to wound healing complications, infection, nonunion, malunion, need for further surgery, damage to surrounding structures and continued pain.  They understand there is no guarantees to an acceptable outcome.  After weighing these risks they opted to proceed with surgery.     Erle Crocker, MD    12/08/2020 8:25 AM

## 2020-12-08 NOTE — Op Note (Signed)
Kimberly Wheeler female 65 y.o. 12/08/2020  PreOperative Diagnosis: Left hallux distal phalanx fracture  PostOperative Diagnosis: Same  PROCEDURE: Open reduction internal fixation of left hallux distal phalanx fracture  SURGEON: Melony Overly, MD  ASSISTANT: None  ANESTHESIA: General LMA with infiltration of 10 cc of quarter percent Marcaine plain  FINDINGS: Displaced distal phalanx fracture/avulsion of the extensor houses longus tendon  IMPLANTS: Arthrex 1.6 millimeters screw  INDICATIONS:65 y.o. female stubbed her toe and had an avulsion fracture of her distal phalanx at the site of the extensor houses longus tendon.  She had a mallet toe.  She had displacement and skin tenting due to the fracture.  She was indicated for surgery due to amount of displacement and disruption of the tendinous attachment.   Patient understood the risks, benefits and alternatives to surgery which include but are not limited to wound healing complications, infection, nonunion, malunion, need for further surgery as well as damage to surrounding structures. They also understood the potential for continued pain in that there were no guarantees of acceptable outcome After weighing these risks the patient opted to proceed with surgery.  PROCEDURE: Patient was identified in the preoperative holding area.  The left foot was marked by myself.  Consent was signed by myself and the patient.  Block was performed by anesthesia in the preoperative holding area.  Patient was taken to the operative suite and placed supine on the operative table.  General LMA anesthesia was induced without difficulty. Bump was placed under the operative hip and bone foam was used.  All bony prominences were well padded.  Preoperative antibiotics were given.  Left lower extremity was prepped and draped in the usual sterile fashion.  Surgical timeout was performed.  A 4 inch Esmarch tourniquet was placed about the ankle.  We began making a  curvilinear incision overlying the fracture site and the interphalangeal joint of the left hallux.  This taken sharply down through skin and subcutaneous tissue.  Blunt dissection was used to mobilize skin flaps.  The fracture was identified and the soft tissue surrounding fracture was mobilized.  The fracture site was inspected.  There is hematoma there.  Using a curette and rondure the fracture site was cleaned.  Then rondure was used to clear out the area.  There was some small intra-articular component of the fracture fragment.  The fracture was then reduced under direct visualization and held with a K wire.  The K wire was used to joystick the fracture into the appropriate position and hold provisionally.  Then the hallux was held in a maximally extended position at the IP joint and a single 1.6 millimeters screw was placed across the fracture site with good fixation.  Fluoroscopy confirmed appropriate reduction and screw placement and length.  Then a single K wire was placed from the distal aspect of the hallux across the interphalangeal joint to hold it in a maximally extended position.  Again fluoroscopy confirmed appropriate position of the joint and K wire.  Then the wound was irrigated copiously with normal saline.  The wound was closed in a layered fashion using 3-0 Monocryl and 3-0 nylon suture.  Soft dressing was placed.  The K wire in the hallux remained and was bent.  Tourniquet was released.  She was awakened from anesthesia and taken recovery in stable condition.  There were no complications.  Counts were correct. POST OPERATIVE INSTRUCTIONS: Weightbearing as tolerated in a postoperative shoe Keep pin in place Follow-up in 2 weeks for suture  removal and x-rays of the left foot on arrival Pin to remain in place 4 to 6 weeks.   BLOOD LOSS:  Minimal         DRAINS: none         SPECIMEN: none       COMPLICATIONS:  * No complications entered in OR log *         Disposition: PACU -  hemodynamically stable.         Condition: stable

## 2020-12-08 NOTE — Discharge Instructions (Addendum)
DR. Lucia Gaskins FOOT & ANKLE SURGERY POST-OP INSTRUCTIONS   Pain Management The numbing medicine and your leg will last around 18 hours, take a dose of your pain medicine as soon as you feel it wearing off to avoid rebound pain. Keep your foot elevated above heart level.  Make sure that your heel hangs free ('floats'). Take all prescribed medication as directed. If taking narcotic pain medication you may want to use an over-the-counter stool softener to avoid constipation. You may take over-the-counter NSAIDs (ibuprofen, naproxen, etc.) as well as over-the-counter acetaminophen as directed on the packaging as a supplement for your pain and may also use it to wean away from the prescription medication.  Activity Heel WB in post operative shoe. Keep dressing in place  First Postoperative Visit Your first postop visit will be at least 2 weeks after surgery.  This should be scheduled when you schedule surgery. If you do not have a postoperative visit scheduled please call [phone] to schedule an appointment. At the appointment your incision will be evaluated for suture removal, x-rays will be obtained if necessary.  General Instructions Swelling is very common after foot and ankle surgery.  It often takes 3 months for the foot and ankle to begin to feel comfortable.  Some amount of swelling will persist for 6-12 months. DO NOT change the dressing.  If there is a problem with the dressing (too tight, loose, gets wet, etc.) please contact Dr. Pollie Friar office. DO NOT get the dressing wet.  For showers you can use an over-the-counter cast cover or wrap a washcloth around the top of your dressing and then cover it with a plastic bag and tape it to your leg. DO NOT soak the incision (no tubs, pools, bath, etc.) until you have approval from Dr. Lucia Gaskins.  Contact Dr. Huel Cote office or go to Emergency Room if: Temperature above 101 F. Increasing pain that is unresponsive to pain medication or  elevation Excessive redness or swelling in your foot Dressing problems - excessive bloody drainage, looseness or tightness, or if dressing gets wet Develop pain, swelling, warmth, or discoloration of your calf         Post Anesthesia Home Care Instructions  Activity: Get plenty of rest for the remainder of the day. A responsible individual must stay with you for 24 hours following the procedure.  For the next 24 hours, DO NOT: -Drive a car -Paediatric nurse -Drink alcoholic beverages -Take any medication unless instructed by your physician -Make any legal decisions or sign important papers.  Meals: Start with liquid foods such as gelatin or soup. Progress to regular foods as tolerated. Avoid greasy, spicy, heavy foods. If nausea and/or vomiting occur, drink only clear liquids until the nausea and/or vomiting subsides. Call your physician if vomiting continues.  Special Instructions/Symptoms: Your throat may feel dry or sore from the anesthesia or the breathing tube placed in your throat during surgery. If this causes discomfort, gargle with warm salt water. The discomfort should disappear within 24 hours.

## 2020-12-09 ENCOUNTER — Encounter (HOSPITAL_BASED_OUTPATIENT_CLINIC_OR_DEPARTMENT_OTHER): Payer: Self-pay | Admitting: Orthopaedic Surgery

## 2021-08-24 ENCOUNTER — Other Ambulatory Visit: Payer: Self-pay | Admitting: Obstetrics & Gynecology

## 2021-08-26 ENCOUNTER — Other Ambulatory Visit: Payer: Self-pay | Admitting: Obstetrics & Gynecology

## 2021-08-26 DIAGNOSIS — Z1231 Encounter for screening mammogram for malignant neoplasm of breast: Secondary | ICD-10-CM

## 2021-09-01 ENCOUNTER — Ambulatory Visit
Admission: RE | Admit: 2021-09-01 | Discharge: 2021-09-01 | Disposition: A | Discharge status: Home / Self Care | Payer: PRIVATE HEALTH INSURANCE | Source: Ambulatory Visit | Attending: Obstetrics & Gynecology | Admitting: Obstetrics & Gynecology

## 2021-09-01 DIAGNOSIS — Z1231 Encounter for screening mammogram for malignant neoplasm of breast: Secondary | ICD-10-CM

## 2021-12-27 ENCOUNTER — Other Ambulatory Visit (HOSPITAL_COMMUNITY)
Admission: RE | Admit: 2021-12-27 | Discharge: 2021-12-27 | Disposition: A | Discharge status: Home / Self Care | Payer: PRIVATE HEALTH INSURANCE | Source: Ambulatory Visit | Attending: Obstetrics & Gynecology | Admitting: Obstetrics & Gynecology

## 2021-12-27 ENCOUNTER — Ambulatory Visit (INDEPENDENT_AMBULATORY_CARE_PROVIDER_SITE_OTHER): Payer: PRIVATE HEALTH INSURANCE | Admitting: Obstetrics & Gynecology

## 2021-12-27 ENCOUNTER — Encounter (HOSPITAL_BASED_OUTPATIENT_CLINIC_OR_DEPARTMENT_OTHER): Payer: Self-pay | Admitting: Obstetrics & Gynecology

## 2021-12-27 VITALS — BP 127/84 | HR 70 | Ht 66.0 in | Wt 148.0 lb

## 2021-12-27 DIAGNOSIS — Z124 Encounter for screening for malignant neoplasm of cervix: Secondary | ICD-10-CM | POA: Diagnosis present

## 2021-12-27 DIAGNOSIS — Z780 Asymptomatic menopausal state: Secondary | ICD-10-CM

## 2021-12-27 DIAGNOSIS — D251 Intramural leiomyoma of uterus: Secondary | ICD-10-CM

## 2021-12-27 DIAGNOSIS — Z01419 Encounter for gynecological examination (general) (routine) without abnormal findings: Secondary | ICD-10-CM

## 2021-12-27 DIAGNOSIS — Z0000 Encounter for general adult medical examination without abnormal findings: Secondary | ICD-10-CM

## 2021-12-27 DIAGNOSIS — Z803 Family history of malignant neoplasm of breast: Secondary | ICD-10-CM

## 2021-12-27 NOTE — Progress Notes (Signed)
66 y.o. G25P2002 Divorced White or Caucasian female here for annual exam.  Doing well.  Denies vaginal bleeding.  Not using estradiol cream.   She is SA with same partner.  He lives in [place].   Since I saw pt last, her sister was diagnosed with breast cancer.  [place] model done today.  Lifetime risk was 14.8%.  Genetic testing and other considerations as well as high risk breast clinic referral discussed.  Pt open to genetic testing.  Screening MRI discussed with pt today as well.  Patient's last menstrual period was 03/07/2011.          Sexually active: Yes.    The current method of family planning is post menopausal status.    Exercising: Yes.    5 days a week Smoker:  no  Health Maintenance: Pap:  08/05/2016 Negative History of abnormal Pap:  no MMG:  09/01/2021 Negative Colonoscopy:  04/20/2018, follow up 10 years BMD:   ordered for next year with MMG Screening Labs: ordered   reports that she has never smoked. She has never used smokeless tobacco. She reports current alcohol use. She reports that she does not use drugs.  Past Medical History:  Diagnosis Date   Arthritis    fingers   Chronic UTI (urinary tract infection)    Fibroid    Injury of fifth finger of right hand    Lesion of bladder    SVT (supraventricular tachycardia) ([place])     Past Surgical History:  Procedure Laterality Date   CESAREAN SECTION  1986   CESAREAN SECTION W/BTL  1988   COLONOSCOPY  01/22/2018    per Dr. Collene Mares, benign polyp, repeat in 10 yrs    CYSTOSCOPY W/ RETROGRADES  06/15/2011   Procedure: CYSTOSCOPY WITH RETROGRADE PYELOGRAM;  Surgeon: Molli Hazard, [place];  Location: [facility];  Service: Urology;  Laterality: Bilateral;  (BIL) RPG   CYSTOSCOPY WITH BIOPSY  06/15/2011   Procedure: CYSTOSCOPY WITH BIOPSY;  Surgeon: Molli Hazard, [place];  Location: [facility];  Service: Urology;  Laterality: N/A;  BLADDER BIOPSY C-ARM CAMERA     LOOP RECORDER  INSERTION N/A 04/06/2017   Procedure: LOOP RECORDER INSERTION;  Surgeon: Evans Lance, [place];  Location: [place] [facility];  Service: Cardiovascular;  Laterality: N/A;   OPEN REDUCTION INTERNAL FIXATION (ORIF) DISTAL PHALANX Left 12/08/2020   Procedure: OPEN TREATMENT OF LEFT HALLUX DISTAL PHALANX FRACTURE;  Surgeon: Erle Crocker, [place];  Location: [place];  Service: Orthopedics;  Laterality: Left;   SALPINGOOPHORECTOMY  1991   LEFT   SVT ABLATION N/A 05/24/2017   Procedure: SVT ABLATION;  Surgeon: Evans Lance, [place];  Location: [place] [facility];  Service: Cardiovascular;  Laterality: N/A;    Current Outpatient Medications  Medication Sig Dispense Refill   Cyanocobalamin (B-12 PO) Take by mouth.     LORazepam (ATIVAN) 0.5 MG tablet TAKE 1 TABLET BY MOUTH EVERY 6 HOURS AS NEEDED FOR ANXIETY 60 tablet 5   No current facility-administered medications for this visit.    Family History  Problem Relation Age of Onset   Osteoporosis Mother    Prostate cancer Father    Diabetes Maternal Grandmother    Heart attack Maternal Grandmother    Breast cancer Maternal Aunt    Breast cancer Sister    ROS Genitourinary:negative  Exam:   BP 127/84 (BP Location: Right Arm, Patient Position: Sitting, Cuff Size: Normal)   Pulse 70  Ht '5\' 6"'$  (1.676 m) Comment: reported  Wt 148 lb (67.1 kg)   LMP 03/07/2011   BMI 23.89 kg/m   Height: '5\' 6"'$  (167.6 cm) (reported)  General appearance: alert, cooperative and appears stated age Head: Normocephalic, without obvious abnormality, atraumatic Neck: no adenopathy, supple, symmetrical, trachea midline and thyroid normal to inspection and palpation Lungs: clear to auscultation bilaterally Breasts: normal appearance, no masses or tenderness Heart: regular rate and rhythm Abdomen: soft, non-tender; bowel sounds normal; no masses,  no organomegaly Extremities: extremities normal, atraumatic, no cyanosis or edema Skin: Skin color,  texture, turgor normal. No rashes or lesions Lymph nodes: Cervical, supraclavicular, and axillary nodes normal. No abnormal inguinal nodes palpated Neurologic: Grossly normal   Pelvic: External genitalia:  no lesions              Urethra:  normal appearing urethra with no masses, tenderness or lesions              Bartholins and Skenes: normal                 Vagina: normal appearing vagina with normal color and no discharge, no lesions              Cervix: no lesions              Pap taken: Yes.   Bimanual Exam:  Uterus:  about 8 weeks and globular, mobile, non tender              Adnexa: normal adnexa and no mass, fullness, tenderness               Rectovaginal: Confirms               Anus:  normal sphincter tone, no lesions  Chaperone, Octaviano Batty, CMA, was present for exam.  Assessment/Plan: 1. Well woman exam with routine gynecological exam - Pap smear obtained today - Mammogram 09/01/2021 - Colonoscopy 04/20/2018 - Bone mineral density ordered to do with MMG next yaer - lab work done done with PCP - vaccines reviewed/updated  2. Postmenopausal - DG BONE DENSITY (DXA); Future  3. Blood tests for routine general physical examination - CBC - Comprehensive metabolic panel - Hemoglobin A1c - TSH - Lipid panel  4. Cervical cancer screening - Cytology - PAP( [place])  5. Family history of breast cancer - Ambulatory referral to Genetics  6. Intramural leiomyoma of uterus

## 2021-12-28 ENCOUNTER — Encounter (HOSPITAL_BASED_OUTPATIENT_CLINIC_OR_DEPARTMENT_OTHER): Payer: Self-pay | Admitting: Obstetrics & Gynecology

## 2021-12-28 LAB — CBC
Hematocrit: 40.8 % (ref 34.0–46.6)
Hemoglobin: 13.9 g/dL (ref 11.1–15.9)
MCH: 31 pg (ref 26.6–33.0)
MCHC: 34.1 g/dL (ref 31.5–35.7)
MCV: 91 fL (ref 79–97)
Platelets: 228 10*3/uL (ref 150–450)
RBC: 4.49 x10E6/uL (ref 3.77–5.28)
RDW: 12.2 % (ref 11.7–15.4)
WBC: 6.2 10*3/uL (ref 3.4–10.8)

## 2021-12-28 LAB — COMPREHENSIVE METABOLIC PANEL
ALT: 17 IU/L (ref 0–32)
AST: 23 IU/L (ref 0–40)
Albumin/Globulin Ratio: 1.7 (ref 1.2–2.2)
Albumin: 4.5 g/dL (ref 3.9–4.9)
Alkaline Phosphatase: 47 IU/L (ref 44–121)
BUN/Creatinine Ratio: 13 (ref 12–28)
BUN: 11 mg/dL (ref 8–27)
Bilirubin Total: 0.7 mg/dL (ref 0.0–1.2)
CO2: 26 mmol/L (ref 20–29)
Calcium: 9.9 mg/dL (ref 8.7–10.3)
Chloride: 102 mmol/L (ref 96–106)
Creatinine, Ser: 0.84 mg/dL (ref 0.57–1.00)
Globulin, Total: 2.6 g/dL (ref 1.5–4.5)
Glucose: 90 mg/dL (ref 70–99)
Potassium: 4.1 mmol/L (ref 3.5–5.2)
Sodium: 142 mmol/L (ref 134–144)
Total Protein: 7.1 g/dL (ref 6.0–8.5)
eGFR: 78 mL/min/{1.73_m2} (ref >59)

## 2021-12-28 LAB — LIPID PANEL
Chol/HDL Ratio: 2.5 ratio (ref 0.0–4.4)
Cholesterol, Total: 256 mg/dL — ABNORMAL HIGH (ref 100–199)
HDL: 103 mg/dL (ref >39)
LDL Chol Calc (NIH): 138 mg/dL — ABNORMAL HIGH (ref 0–99)
Triglycerides: 90 mg/dL (ref 0–149)
VLDL Cholesterol Cal: 15 mg/dL (ref 5–40)

## 2021-12-28 LAB — HEMOGLOBIN A1C
Est. average glucose Bld gHb Est-mCnc: 103 mg/dL
Hgb A1c MFr Bld: 5.2 % (ref 4.8–5.6)

## 2021-12-28 LAB — TSH: TSH: 2.61 u[IU]/mL (ref 0.450–4.500)

## 2021-12-29 LAB — CYTOLOGY - PAP
Comment: NEGATIVE
Diagnosis: NEGATIVE
High risk HPV: NEGATIVE

## 2021-12-30 ENCOUNTER — Telehealth: Payer: Self-pay | Admitting: Licensed Clinical Social Worker

## 2021-12-30 NOTE — Telephone Encounter (Signed)
Scheduled appt per 7/24 referral. Pt is aware of appt date and time. Pt is aware to arrive 15 mins prior to appt time and to bring and updated insurance card. Pt is aware of appt location.

## 2022-01-04 ENCOUNTER — Other Ambulatory Visit (HOSPITAL_BASED_OUTPATIENT_CLINIC_OR_DEPARTMENT_OTHER): Payer: Self-pay | Admitting: Obstetrics & Gynecology

## 2022-01-04 DIAGNOSIS — G4700 Insomnia, unspecified: Secondary | ICD-10-CM

## 2022-01-04 MED ORDER — ZOLPIDEM TARTRATE 5 MG PO TABS: ORAL_TABLET | ORAL | 0 refills | Status: DC

## 2022-02-21 ENCOUNTER — Inpatient Hospital Stay: Payer: PRIVATE HEALTH INSURANCE | Admitting: Licensed Clinical Social Worker

## 2022-02-21 ENCOUNTER — Inpatient Hospital Stay: Payer: PRIVATE HEALTH INSURANCE

## 2022-02-28 ENCOUNTER — Other Ambulatory Visit (HOSPITAL_BASED_OUTPATIENT_CLINIC_OR_DEPARTMENT_OTHER): Payer: Self-pay | Admitting: Obstetrics & Gynecology

## 2022-02-28 DIAGNOSIS — G4700 Insomnia, unspecified: Secondary | ICD-10-CM

## 2022-04-12 ENCOUNTER — Ambulatory Visit (INDEPENDENT_AMBULATORY_CARE_PROVIDER_SITE_OTHER): Payer: PRIVATE HEALTH INSURANCE | Admitting: Family Medicine

## 2022-04-12 ENCOUNTER — Encounter: Payer: Self-pay | Admitting: Family Medicine

## 2022-04-12 VITALS — BP 133/74 | HR 78 | Temp 98.7°F | Ht 66.0 in | Wt 142.5 lb

## 2022-04-12 DIAGNOSIS — J40 Bronchitis, not specified as acute or chronic: Secondary | ICD-10-CM | POA: Diagnosis not present

## 2022-04-12 MED ORDER — AZITHROMYCIN 250 MG PO TABS: ORAL_TABLET | ORAL | 0 refills | Status: AC

## 2022-04-12 NOTE — Progress Notes (Signed)
Subjective:     Patient ID: Kimberly Wheeler, female    DOB: 1955-10-10, 66 y.o.   MRN: [id number]  Chief Complaint  Patient presents with   Cough    Productive cough that started 2 weeks ago causing sore throat   Sinus drainage    HPI 2 wks, cough-clear mucus, congestion, sinus drainage. No face pain. Soreness R throat.  Taking mucinex.  No f/c.  No v/d.  At home covid neg on 11/5.   Health Maintenance Due  Topic Date Due   DEXA SCAN  Never done    Past Medical History:  Diagnosis Date   Arthritis    fingers   Chronic UTI (urinary tract infection)    Fibroid    Injury of fifth finger of right hand    Lesion of bladder    SVT (supraventricular tachycardia)     Past Surgical History:  Procedure Laterality Date   CESAREAN SECTION  1986   CESAREAN SECTION W/BTL  1988   COLONOSCOPY  01/22/2018    per Dr. Collene Mares, benign polyp, repeat in 10 yrs    CYSTOSCOPY W/ RETROGRADES  06/15/2011   Procedure: CYSTOSCOPY WITH RETROGRADE PYELOGRAM;  Surgeon: Molli Hazard, [place];  Location: [facility];  Service: Urology;  Laterality: Bilateral;  (BIL) RPG   CYSTOSCOPY WITH BIOPSY  06/15/2011   Procedure: CYSTOSCOPY WITH BIOPSY;  Surgeon: Molli Hazard, [place];  Location: [facility];  Service: Urology;  Laterality: N/A;  BLADDER BIOPSY C-ARM CAMERA     LOOP RECORDER INSERTION N/A 04/06/2017   Procedure: LOOP RECORDER INSERTION;  Surgeon: Evans Lance, [place];  Location: [place] [facility];  Service: Cardiovascular;  Laterality: N/A;   OPEN REDUCTION INTERNAL FIXATION (ORIF) DISTAL PHALANX Left 12/08/2020   Procedure: OPEN TREATMENT OF LEFT HALLUX DISTAL PHALANX FRACTURE;  Surgeon: Erle Crocker, [place];  Location: [place];  Service: Orthopedics;  Laterality: Left;   SALPINGOOPHORECTOMY  1991   LEFT   SVT ABLATION N/A 05/24/2017   Procedure: SVT ABLATION;  Surgeon: Evans Lance, [place];  Location: [place] [facility];  Service:  Cardiovascular;  Laterality: N/A;    Outpatient Medications Prior to Visit  Medication Sig Dispense Refill   Cyanocobalamin (B-12 PO) Take by mouth.     LORazepam (ATIVAN) 0.5 MG tablet TAKE 1 TABLET BY MOUTH EVERY 6 HOURS AS NEEDED FOR ANXIETY (Patient not taking: Reported on 04/12/2022) 60 tablet 5   zolpidem (AMBIEN) 5 MG tablet TAKE 1 TABLET NIGHTLY AS NEEDED FOR INSOMNIA. DO NOT USE EVERY NIGHT. USE SPARINGLY. (Patient not taking: Reported on 04/12/2022) 30 tablet 0   No facility-administered medications prior to visit.    Allergies  Allergen Reactions   Darvon Nausea And Vomiting   Macrodantin [Nitrofurantoin] Nausea Only   Propoxyphene Nausea And Vomiting   ROS neg/noncontributory except as noted HPI/below      Objective:     BP 133/74   Pulse 78   Temp 98.7 F (37.1 C) (Temporal)   Ht '5\' 6"'$  (1.676 m)   Wt 142 lb 8 oz (64.6 kg)   LMP 03/07/2011   SpO2 98%   BMI 23.00 kg/m  Wt Readings from Last 3 Encounters:  04/12/22 142 lb 8 oz (64.6 kg)  12/27/21 148 lb (67.1 kg)  12/08/20 143 lb 1.3 oz (64.9 kg)    Physical Exam   Gen: WDWN NAD HEENT: NCAT, conjunctiva not injected, sclera nonicteric TM WNL B, OP moist, no  exudates  NECK:  supple, no thyromegaly, no nodes, no carotid bruits CARDIAC: RRR, S1S2+, no murmur.  LUNGS: CTAB. No wheezes EXT:  no edema MSK: no gross abnormalities.  NEURO: A&O x3.  CN II-XII intact.  PSYCH: normal mood. Good eye contact     Assessment & Plan:   Problem List Items Addressed This Visit   None Visit Diagnoses     Bronchitis    -  Primary     Bronchitis-zpk to hold if worse/not improving next few days.  Meds ordered this encounter  Medications   azithromycin (ZITHROMAX) 250 MG tablet    Sig: Take 2 tablets on day 1, then 1 tablet daily on days 2 through 5    Dispense:  6 tablet    Refill:  0    Wellington Hampshire, MD

## 2022-04-12 NOTE — Patient Instructions (Signed)
It was very nice to see you today!  Enjoy your trip!      PLEASE NOTE:  If you had any lab tests please let us know if you have not heard back within a few days. You may see your results on MyChart before we have a chance to review them but we will give you a call once they are reviewed by [place]. If we ordered any referrals today, please let us know if you have not heard from their office within the next week.   Please try these tips to maintain a healthy lifestyle:  Eat most of your calories during the day when you are active. Eliminate processed foods including packaged sweets (pies, cakes, cookies), reduce intake of potatoes, white bread, white pasta, and white rice. Look for whole grain options, oat flour or almond flour.  Each meal should contain half fruits/vegetables, one quarter protein, and one quarter carbs (no bigger than a computer mouse).  Cut down on sweet beverages. This includes juice, soda, and sweet tea. Also watch fruit intake, though this is a healthier sweet option, it still contains natural sugar! Limit to 3 servings daily.  Drink at least 1 glass of water with each meal and aim for at least 8 glasses per day  Exercise at least 150 minutes every week.

## 2022-04-19 ENCOUNTER — Other Ambulatory Visit: Payer: PRIVATE HEALTH INSURANCE

## 2022-04-19 ENCOUNTER — Encounter: Payer: PRIVATE HEALTH INSURANCE | Admitting: Genetic Counselor

## 2022-06-21 ENCOUNTER — Inpatient Hospital Stay: Payer: Medicare HMO

## 2022-06-21 ENCOUNTER — Encounter: Payer: Self-pay | Admitting: Genetic Counselor

## 2022-06-21 ENCOUNTER — Inpatient Hospital Stay: Payer: Medicare HMO | Admitting: Genetic Counselor

## 2022-06-21 DIAGNOSIS — Z8042 Family history of malignant neoplasm of prostate: Secondary | ICD-10-CM

## 2022-06-21 DIAGNOSIS — Z803 Family history of malignant neoplasm of breast: Secondary | ICD-10-CM | POA: Diagnosis not present

## 2022-06-21 LAB — GENETIC SCREENING ORDER

## 2022-06-21 NOTE — Progress Notes (Signed)
REFERRING PROVIDER: Megan Salon, MD [address] [place],  [place] [address]  PRIMARY PROVIDER:  Laurey Morale, MD  PRIMARY REASON FOR VISIT:  1. Family history of breast cancer   2. Family history of prostate cancer in father     HISTORY OF PRESENT ILLNESS:   Kimberly Wheeler, a 67 y.o. female, was seen for a [facility] cancer genetics consultation at the request of Dr. Sabra Heck due to a family history of cancer.  Ms. Scipio presents to clinic today to discuss the possibility of a hereditary predisposition to cancer, to discuss genetic testing, and to further clarify her future cancer risks, as well as potential cancer risks for family members.   Ms. Farone is a 67 y.o. female with no personal history of cancer.    CANCER HISTORY:  Oncology History   No history exists.    RISK FACTORS:  Menarche was at age 39.  First live birth at age 59.  OCP use for approximately 1 year.  Ovaries intact: one Uterus intact: yes.  Menopausal status: menopause at age 19  HRT use: 0 years. Colonoscopy: yes;  reports 1 benign polyp 3 years ago, 10 year plan . Mammogram within the last year: yes. Number of breast biopsies: 0. Up to date with pelvic exams: yes. Any excessive radiation exposure in the past: no  Past Medical History:  Diagnosis Date   Arthritis    fingers   Chronic UTI (urinary tract infection)    Fibroid    Injury of fifth finger of right hand    Lesion of bladder    SVT (supraventricular tachycardia)     Past Surgical History:  Procedure Laterality Date   CESAREAN SECTION  1986   CESAREAN SECTION W/BTL  1988   COLONOSCOPY  01/22/2018    per Dr. Collene Mares, benign polyp, repeat in 10 yrs    CYSTOSCOPY W/ RETROGRADES  06/15/2011   Procedure: CYSTOSCOPY WITH RETROGRADE PYELOGRAM;  Surgeon: Molli Hazard, [place];  Location: [facility];  Service: Urology;  Laterality: Bilateral;  (BIL) RPG   CYSTOSCOPY WITH BIOPSY  06/15/2011   Procedure:  CYSTOSCOPY WITH BIOPSY;  Surgeon: Molli Hazard, [place];  Location: [facility];  Service: Urology;  Laterality: N/A;  BLADDER BIOPSY C-ARM CAMERA     LOOP RECORDER INSERTION N/A 04/06/2017   Procedure: LOOP RECORDER INSERTION;  Surgeon: Evans Lance, [place];  Location: [place] [facility];  Service: Cardiovascular;  Laterality: N/A;   OPEN REDUCTION INTERNAL FIXATION (ORIF) DISTAL PHALANX Left 12/08/2020   Procedure: OPEN TREATMENT OF LEFT HALLUX DISTAL PHALANX FRACTURE;  Surgeon: Erle Crocker, [place];  Location: [place];  Service: Orthopedics;  Laterality: Left;   SALPINGOOPHORECTOMY  1991   LEFT   SVT ABLATION N/A 05/24/2017   Procedure: SVT ABLATION;  Surgeon: Evans Lance, [place];  Location: [place] [facility];  Service: Cardiovascular;  Laterality: N/A;    Social History   Socioeconomic History   Marital status: Divorced    Spouse name: Not on file   Number of children: 2   Years of education: Not on file   Highest education level: Not on file  Occupational History    Comment: Real estate broker  Tobacco Use   Smoking status: Never   Smokeless tobacco: Never  Vaping Use   Vaping Use: Never used  Substance and Sexual Activity   Alcohol use: Yes    Comment: 3-5   Drug use: No  Sexual activity: Yes    Partners: Male    Birth control/protection: Surgical    Comment: Tubal Ligation  Other Topics Concern   Not on file  Social History Narrative   Not on file   Social Determinants of Health   Financial Resource Strain: Not on file  Food Insecurity: Not on file  Transportation Needs: Not on file  Physical Activity: Not on file  Stress: Not on file  Social Connections: Not on file     FAMILY HISTORY:  We obtained a detailed, 4-generation family history.  Significant diagnoses are listed below: Family History  Problem Relation Age of Onset   Osteoporosis Mother    Prostate cancer Father 51       metastatic   Breast cancer  Sister 88   Heart attack Paternal 58        late 82's early 58's   Breast cancer Paternal Aunt 26 - 32       bilateral, metastatic   Diabetes Maternal Grandmother    Heart attack Maternal Grandmother      Ms. Harkey sister was diagnosed with breast cancer at age 90 and she declined genetic testing. Ms. Noyes father was diagnosed with prostate cancer at age 71, he died at age 68 due to metastatic prostate cancer. Her paternal aunt was diagnosed with bilateral breast cancer in her late 24s and died a couple years after her diagnosis due to metastatic breast cancer. Ms. Apsey is unaware of previous family history of genetic testing for hereditary cancer risks. There is no reported Ashkenazi Jewish ancestry.  GENETIC COUNSELING ASSESSMENT: Ms. Aschenbrenner is a 67 y.o. female with a family history of cancer which is somewhat suggestive of a hereditary predisposition to cancer given her family history of breast and prostate cancer. We, therefore, discussed and recommended the following at today's visit.   DISCUSSION: We discussed that 5 - 10% of cancer is hereditary, with most cases of hereditary breast cancer associated with BRCA1/2.  There are other genes that can be associated with hereditary breast cancer syndromes.  We discussed that testing is beneficial for several reasons, including knowing about other cancer risks, identifying potential screening and risk-reduction options that may be appropriate, and to understanding if other family members could be at risk for cancer and allowing them to undergo genetic testing.  We reviewed the characteristics, features and inheritance patterns of hereditary cancer syndromes. We also discussed genetic testing, including the appropriate family members to test, the process of testing, insurance coverage and turn-around-time for results. We discussed the implications of a negative, positive, carrier and/or variant of uncertain significant result. We  discussed that negative results would be uninformative given that Ms. Gellner does not have a personal history of cancer. We recommended Ms. Goodwill pursue genetic testing for a panel that contains genes associated with breast and prostate cancer.  Ms. Bracher was offered a common hereditary cancer panel (47 genes) and an expanded pan-cancer panel (77 genes). Ms. Charette was informed of the benefits and limitations of each panel, including that expanded pan-cancer panels contain several genes that do not have clear management guidelines at this point in time.  We also discussed that as the number of genes included on a panel increases, the chances of variants of uncertain significance increases.  After considering the benefits and limitations of each gene panel, Ms. Gaffey elected to have [place] Panel.  The CancerNext-Expanded gene panel offered by [facility] and includes sequencing, rearrangement, and RNA analysis for the  following 77 genes: AIP, ALK, APC, ATM, AXIN2, BAP1, BARD1, BLM, BMPR1A, BRCA1, BRCA2, BRIP1, CDC73, CDH1, CDK4, CDKN1B, CDKN2A, CHEK2, CTNNA1, DICER1, FANCC, FH, FLCN, GALNT12, KIF1B, LZTR1, MAX, MEN1, MET, MLH1, MSH2, MSH3, MSH6, MUTYH, NBN, NF1, NF2, NTHL1, PALB2, PHOX2B, PMS2, POT1, PRKAR1A, PTCH1, PTEN, RAD51C, RAD51D, RB1, RECQL, RET, SDHA, SDHAF2, SDHB, SDHC, SDHD, SMAD4, SMARCA4, SMARCB1, SMARCE1, STK11, SUFU, TMEM127, TP53, TSC1, TSC2, VHL and XRCC2 (sequencing and deletion/duplication); EGFR, EGLN1, HOXB13, KIT, MITF, PDGFRA, POLD1, and POLE (sequencing only); EPCAM and GREM1 (deletion/duplication only).    Based on Ms. Wahlstrom family history of cancer, she meets medical criteria for genetic testing. Despite that she meets criteria, she may still have an out of pocket cost. We discussed that if her out of pocket cost for testing is over $100, the laboratory will call and confirm whether she wants to proceed with testing.  If the out of pocket cost of  testing is less than $100 she will be billed by the genetic testing laboratory.   We discussed that some people do not want to undergo genetic testing due to fear of genetic discrimination.  A federal law called the Genetic Information Non-Discrimination Act (GINA) of 2008 helps protect individuals against genetic discrimination based on their genetic test results.  It impacts both health insurance and employment.  With health insurance, it protects against increased premiums, being kicked off insurance or being forced to take a test in order to be insured.  For employment it protects against hiring, firing and promoting decisions based on genetic test results.  GINA does not apply to those in the [employer], those who work for companies with less than 15 employees, and new life insurance or long-term disability insurance policies.  Health status due to a cancer diagnosis is not protected under GINA.  PLAN: After considering the risks, benefits, and limitations, Ms. Hertenstein provided informed consent to pursue genetic testing and the blood sample was sent to [facility] for analysis of the CancerNext-Expanded Panel. Results should be available within approximately 2-3 weeks' time, at which point they will be disclosed by telephone to Ms. Pallett, as will any additional recommendations warranted by these results. Ms. Paulo will receive a summary of her genetic counseling visit and a copy of her results once available. This information will also be available in Epic.   Ms. Lakey questions were answered to her satisfaction today. Our contact information was provided should additional questions or concerns arise. Thank you for the referral and allowing [place] to share in the care of your patient.   Lucille Passy, MS, [facility] Genetic Counselor [email or web address] (P) [phone]  The patient was seen for a total of 35 minutes in face-to-face genetic counseling. The patient was seen alone.   Drs. Lindi Adie and/or Burr Medico were available to discuss this case as needed.  _______________________________________________________________________ For Office Staff:  Number of people involved in session: 1 Was an Intern/ student involved with case: no

## 2022-07-05 ENCOUNTER — Encounter: Payer: Self-pay | Admitting: Genetic Counselor

## 2022-07-05 ENCOUNTER — Telehealth: Payer: Self-pay | Admitting: Genetic Counselor

## 2022-07-05 DIAGNOSIS — Z1379 Encounter for other screening for genetic and chromosomal anomalies: Secondary | ICD-10-CM | POA: Insufficient documentation

## 2022-07-05 NOTE — Telephone Encounter (Signed)
I contacted Ms. Shrieves to discuss her genetic testing results. No pathogenic variants were identified in the 77 genes analyzed. Detailed clinic note to follow.  The test report has been scanned into EPIC and is located under the Molecular Pathology section of the Results Review tab.  A portion of the result report is included below for reference.   Lucille Passy, MS, [facility] Genetic Counselor [email or web address] (P) [phone]

## 2022-07-11 ENCOUNTER — Ambulatory Visit: Payer: Self-pay | Admitting: Genetic Counselor

## 2022-07-11 DIAGNOSIS — Z1379 Encounter for other screening for genetic and chromosomal anomalies: Secondary | ICD-10-CM

## 2022-07-11 NOTE — Progress Notes (Signed)
HPI:   Kimberly Wheeler was previously seen in the [facility] clinic due to a family history of cancer and concerns regarding a hereditary predisposition to cancer. Please refer to our prior cancer genetics clinic note for more information regarding our discussion, assessment and recommendations, at the time. Kimberly Wheeler recent genetic test results were disclosed to her, as were recommendations warranted by these results. These results and recommendations are discussed in more detail below.  CANCER HISTORY:  Oncology History   No history exists.    FAMILY HISTORY:  We obtained a detailed, 4-generation family history.  Significant diagnoses are listed below:      Family History  Problem Relation Age of Onset   Osteoporosis Mother     Prostate cancer Father 34        metastatic   Breast cancer Sister 63   Heart attack Paternal 30          late 79's early 63's   Breast cancer Paternal Aunt 81 - 67        bilateral, metastatic   Diabetes Maternal Grandmother     Heart attack Maternal Grandmother         Kimberly Wheeler sister was diagnosed with breast cancer at age 39 and she declined genetic testing. Kimberly Wheeler father was diagnosed with prostate cancer at age 77, he died at age 20 due to metastatic prostate cancer. Her paternal aunt was diagnosed with bilateral breast cancer in her late 35s and died a couple years after her diagnosis due to metastatic breast cancer. Kimberly Wheeler is unaware of previous family history of genetic testing for hereditary cancer risks. There is no reported Ashkenazi Jewish ancestry.  GENETIC TEST RESULTS:  The [facility] CancerNext-Expanded Panel found no pathogenic mutations.  The CancerNext-Expanded gene panel offered by [facility] and includes sequencing, rearrangement, and RNA analysis for the following 77 genes: AIP, ALK, APC, ATM, AXIN2, BAP1, BARD1, BLM, BMPR1A, BRCA1, BRCA2, BRIP1, CDC73, CDH1, CDK4, CDKN1B, CDKN2A, CHEK2, CTNNA1, DICER1,  FANCC, FH, FLCN, GALNT12, KIF1B, LZTR1, MAX, MEN1, MET, MLH1, MSH2, MSH3, MSH6, MUTYH, NBN, NF1, NF2, NTHL1, PALB2, PHOX2B, PMS2, POT1, PRKAR1A, PTCH1, PTEN, RAD51C, RAD51D, RB1, RECQL, RET, SDHA, SDHAF2, SDHB, SDHC, SDHD, SMAD4, SMARCA4, SMARCB1, SMARCE1, STK11, SUFU, TMEM127, TP53, TSC1, TSC2, VHL and XRCC2 (sequencing and deletion/duplication); EGFR, EGLN1, HOXB13, KIT, MITF, PDGFRA, POLD1, and POLE (sequencing only); EPCAM and GREM1 (deletion/duplication only).   The test report has been scanned into EPIC and is located under the Molecular Pathology section of the Results Review tab.  A portion of the result report is included below for reference. Genetic testing reported out on 07/04/2022.        Even though a pathogenic variant was not identified, possible explanations for the cancer in the family may include: There may be no hereditary risk for cancer in the family. The cancers in her family may be due to other genetic or environmental factors. There may be a gene mutation in one of these genes that current testing methods cannot detect, but that chance is small. There could be another gene that has not yet been discovered, or that we have not yet tested, that is responsible for the cancer diagnoses in the family.  It is also possible there is a hereditary cause for the cancer in the family that Kimberly Wheeler did not inherit.  Therefore, it is important to remain in touch with cancer genetics in the future so that we can continue to offer Kimberly Wheeler the  most up to date genetic testing.   ADDITIONAL GENETIC TESTING:  We discussed with Kimberly Wheeler that her genetic testing was fairly extensive.  If there are genes identified to increase cancer risk that can be analyzed in the future, we would be happy to discuss and coordinate this testing at that time.     CANCER SCREENING RECOMMENDATIONS:  Kimberly Wheeler test result is considered negative (normal).  This means that we have not identified a  hereditary cause for her family history of cancer at this time.    An individual's cancer risk and medical management are not determined by genetic test results alone. Overall cancer risk assessment incorporates additional factors, including personal medical history, family history, and any available genetic information that may result in a personalized plan for cancer prevention and surveillance. Therefore, it is recommended she continue to follow the cancer management and screening guidelines provided by her primary healthcare provider.  Based on the reported personal and family history, specific cancer screenings for Kimberly Wheeler and her family include:  Breast Cancer Screening:  Multiple prediction models have been developed to assist with breast cancer risk prediction efforts for unaffected women without a known single gene risk factor identified in their family. The Tyrer-Cuzick model is one such risk assessment tool. This model was developed to include extensive family history information, endogenous estrogen exposure, and benign breast disease. The calculation is highly-dependent on the accuracy of clinical data provided by the patient. Other factors not accounted for in the calculation may impact lifetime breast cancer risk including, but not limited to, germline mutations not analyzed by the ordered genetic test or clinical information not provided at the time of the testing. The risk number provided is patient-specific and cannot be used to infer risk to relatives.  Kimberly Wheeler risk score is 18.2%. This is above the general population risk of 12%, therefore, she is encouraged to continue to be mindful of her family history and be diligent with general population breast screening, including annual mammograms. She is encouraged to contact us regarding any changes to her personal or family history, as her recommendations for screening would be altered significantly if her lifetime  risk is determined to be greater than 20% based on updated information.   RECOMMENDATIONS FOR FAMILY MEMBERS:   Since she did not inherit a mutation in a cancer predisposition gene included on this panel, her children could not have inherited a mutation from her in one of these genes. Other members of the family may still carry a pathogenic variant in one of these genes that Kimberly Wheeler did not inherit. Based on the family history, we recommend her sister who was diagnosed with breast cancer have genetic counseling and testing.   FOLLOW-UP:  Cancer genetics is a rapidly advancing field and it is possible that new genetic tests will be appropriate for her and/or her family members in the future. We encouraged her to remain in contact with cancer genetics on an annual basis so we can update her personal and family histories and let her know of advances in cancer genetics that may benefit this family.   Our contact number was provided. Ms. Curet questions were answered to her satisfaction, and she knows she is welcome to call us at anytime with additional questions or concerns.   Lucille Passy, MS, [facility] Genetic Counselor [email or web address] (P) [phone]

## 2022-07-12 ENCOUNTER — Encounter: Payer: Self-pay | Admitting: Genetic Counselor

## 2022-08-16 ENCOUNTER — Other Ambulatory Visit: Payer: Self-pay | Admitting: Obstetrics & Gynecology

## 2022-08-16 DIAGNOSIS — Z139 Encounter for screening, unspecified: Secondary | ICD-10-CM

## 2022-09-08 ENCOUNTER — Encounter: Payer: Self-pay | Admitting: Family Medicine

## 2022-09-08 ENCOUNTER — Ambulatory Visit (INDEPENDENT_AMBULATORY_CARE_PROVIDER_SITE_OTHER): Payer: Medicare HMO | Admitting: Family Medicine

## 2022-09-08 VITALS — BP 122/78 | HR 80 | Temp 98.2°F | Ht 65.5 in | Wt 150.0 lb

## 2022-09-08 DIAGNOSIS — M159 Polyosteoarthritis, unspecified: Secondary | ICD-10-CM | POA: Diagnosis not present

## 2022-09-08 DIAGNOSIS — M1990 Unspecified osteoarthritis, unspecified site: Secondary | ICD-10-CM | POA: Insufficient documentation

## 2022-09-08 DIAGNOSIS — Z0000 Encounter for general adult medical examination without abnormal findings: Secondary | ICD-10-CM

## 2022-09-08 DIAGNOSIS — E785 Hyperlipidemia, unspecified: Secondary | ICD-10-CM | POA: Diagnosis not present

## 2022-09-08 DIAGNOSIS — R739 Hyperglycemia, unspecified: Secondary | ICD-10-CM

## 2022-09-08 LAB — LIPID PANEL
Cholesterol: 284 mg/dL — ABNORMAL HIGH (ref 0–200)
HDL: 100.2 mg/dL (ref >39.00)
LDL Cholesterol: 165 mg/dL — ABNORMAL HIGH (ref 0–99)
NonHDL: 183.62
Total CHOL/HDL Ratio: 3
Triglycerides: 94 mg/dL (ref 0.0–149.0)
VLDL: 18.8 mg/dL (ref 0.0–40.0)

## 2022-09-08 LAB — TSH: TSH: 2.76 u[IU]/mL (ref 0.35–5.50)

## 2022-09-08 LAB — HEMOGLOBIN A1C: Hgb A1c MFr Bld: 5.1 % (ref 4.6–6.5)

## 2022-09-08 LAB — CBC WITH DIFFERENTIAL/PLATELET
Basophils Absolute: 0.1 10*3/uL (ref 0.0–0.1)
Basophils Relative: 1.2 % (ref 0.0–3.0)
Eosinophils Absolute: 0 10*3/uL (ref 0.0–0.7)
Eosinophils Relative: 0.9 % (ref 0.0–5.0)
HCT: 43.7 % (ref 36.0–46.0)
Hemoglobin: 14.6 g/dL (ref 12.0–15.0)
Lymphocytes Relative: 16.8 % (ref 12.0–46.0)
Lymphs Abs: 1 10*3/uL (ref 0.7–4.0)
MCHC: 33.4 g/dL (ref 30.0–36.0)
MCV: 91.8 fl (ref 78.0–100.0)
Monocytes Absolute: 0.4 10*3/uL (ref 0.1–1.0)
Monocytes Relative: 6.8 % (ref 3.0–12.0)
Neutro Abs: 4.2 10*3/uL (ref 1.4–7.7)
Neutrophils Relative %: 74.3 % (ref 43.0–77.0)
Platelets: 254 10*3/uL (ref 150.0–400.0)
RBC: 4.76 Mil/uL (ref 3.87–5.11)
RDW: 12.9 % (ref 11.5–15.5)
WBC: 5.7 10*3/uL (ref 4.0–10.5)

## 2022-09-08 LAB — BASIC METABOLIC PANEL
BUN: 13 mg/dL (ref 6–23)
CO2: 25 mEq/L (ref 19–32)
Calcium: 9.7 mg/dL (ref 8.4–10.5)
Chloride: 103 mEq/L (ref 96–112)
Creatinine, Ser: 0.82 mg/dL (ref 0.40–1.20)
GFR: 75.02 mL/min (ref >60.00)
Glucose, Bld: 103 mg/dL — ABNORMAL HIGH (ref 70–99)
Potassium: 4.3 mEq/L (ref 3.5–5.1)
Sodium: 139 mEq/L (ref 135–145)

## 2022-09-08 LAB — HEPATIC FUNCTION PANEL
ALT: 17 U/L (ref 0–35)
AST: 19 U/L (ref 0–37)
Albumin: 4.5 g/dL (ref 3.5–5.2)
Alkaline Phosphatase: 49 U/L (ref 39–117)
Bilirubin, Direct: 0.1 mg/dL (ref 0.0–0.3)
Total Bilirubin: 0.7 mg/dL (ref 0.2–1.2)
Total Protein: 7.3 g/dL (ref 6.0–8.3)

## 2022-09-08 NOTE — Progress Notes (Signed)
   Subjective:    Patient ID: Loetta Rough, female    DOB: 10-Feb-1956, 67 y.o.   MRN: [id number]  HPI Here to follow up on issues. She feels well in general. The arthritis in her hands flares up at times, but for the most part she does well. She exercises 4-5 day a week. She watches her diet closely.    Review of Systems  Constitutional: Negative.   HENT: Negative.    Eyes: Negative.   Respiratory: Negative.    Cardiovascular: Negative.   Gastrointestinal: Negative.   Genitourinary:  Negative for decreased urine volume, difficulty urinating, dyspareunia, dysuria, enuresis, flank pain, frequency, hematuria, pelvic pain and urgency.  Musculoskeletal:  Positive for arthralgias.  Skin: Negative.   Neurological: Negative.  Negative for headaches.  Psychiatric/Behavioral: Negative.         Objective:   Physical Exam Constitutional:      General: She is not in acute distress.    Appearance: Normal appearance. She is well-developed.  HENT:     Head: Normocephalic and atraumatic.     Right Ear: External ear normal.     Left Ear: External ear normal.     Nose: Nose normal.     Mouth/Throat:     Pharynx: No oropharyngeal exudate.  Eyes:     General: No scleral icterus.    Conjunctiva/sclera: Conjunctivae normal.     Pupils: Pupils are equal, round, and reactive to light.  Neck:     Thyroid: No thyromegaly.     Vascular: No JVD.  Cardiovascular:     Rate and Rhythm: Normal rate and regular rhythm.     Heart sounds: Normal heart sounds. No murmur heard.    No friction rub. No gallop.  Pulmonary:     Effort: Pulmonary effort is normal. No respiratory distress.     Breath sounds: Normal breath sounds. No wheezing or rales.  Chest:     Chest wall: No tenderness.  Abdominal:     General: Bowel sounds are normal. There is no distension.     Palpations: Abdomen is soft. There is no mass.     Tenderness: There is no abdominal tenderness. There is no guarding or rebound.   Musculoskeletal:        General: No tenderness. Normal range of motion.     Cervical back: Normal range of motion and neck supple.  Lymphadenopathy:     Cervical: No cervical adenopathy.  Skin:    General: Skin is warm and dry.     Findings: No erythema or rash.  Neurological:     Mental Status: She is alert and oriented to person, place, and time.     Cranial Nerves: No cranial nerve deficit.     Motor: No abnormal muscle tone.     Coordination: Coordination normal.     Deep Tendon Reflexes: Reflexes are normal and symmetric. Reflexes normal.  Psychiatric:        Behavior: Behavior normal.        Thought Content: Thought content normal.        Judgment: Judgment normal.           Assessment & Plan:  Her OA is stable. She will have a mammogram soon. We will get fasting labs to check lipids, etc. We spent a total of (32   ) minutes reviewing records and discussing these issues.   Alysia Penna, MD

## 2022-09-09 DIAGNOSIS — D239 Other benign neoplasm of skin, unspecified: Secondary | ICD-10-CM | POA: Diagnosis not present

## 2022-09-09 DIAGNOSIS — L821 Other seborrheic keratosis: Secondary | ICD-10-CM | POA: Diagnosis not present

## 2022-09-09 DIAGNOSIS — M67449 Ganglion, unspecified hand: Secondary | ICD-10-CM | POA: Diagnosis not present

## 2022-09-14 ENCOUNTER — Other Ambulatory Visit: Payer: Self-pay

## 2022-09-14 MED ORDER — ATORVASTATIN CALCIUM 10 MG PO TABS: 10.0000 mg | ORAL_TABLET | Freq: Every day | ORAL | 3 refills | Status: DC

## 2022-09-28 ENCOUNTER — Ambulatory Visit
Admission: RE | Admit: 2022-09-28 | Discharge: 2022-09-28 | Disposition: A | Discharge status: Home / Self Care | Payer: PRIVATE HEALTH INSURANCE | Source: Ambulatory Visit | Attending: Obstetrics & Gynecology | Admitting: Obstetrics & Gynecology

## 2022-09-28 DIAGNOSIS — Z1231 Encounter for screening mammogram for malignant neoplasm of breast: Secondary | ICD-10-CM | POA: Diagnosis not present

## 2022-09-28 DIAGNOSIS — Z139 Encounter for screening, unspecified: Secondary | ICD-10-CM

## 2022-10-18 ENCOUNTER — Telehealth: Payer: Self-pay | Admitting: Family Medicine

## 2022-10-18 NOTE — Telephone Encounter (Signed)
Contacted Wilbert M Batterman to schedule their annual wellness visit. Welcome to Medicare visit Due by 04/07/23.  Rudell Cobb AWV direct phone # [phone]   WTM before 04/07/23 per [facility]

## 2023-01-04 ENCOUNTER — Ambulatory Visit (HOSPITAL_BASED_OUTPATIENT_CLINIC_OR_DEPARTMENT_OTHER): Payer: PRIVATE HEALTH INSURANCE | Admitting: Obstetrics & Gynecology

## 2023-01-12 DIAGNOSIS — D2271 Melanocytic nevi of right lower limb, including hip: Secondary | ICD-10-CM | POA: Diagnosis not present

## 2023-01-12 DIAGNOSIS — D2272 Melanocytic nevi of left lower limb, including hip: Secondary | ICD-10-CM | POA: Diagnosis not present

## 2023-01-12 DIAGNOSIS — L719 Rosacea, unspecified: Secondary | ICD-10-CM | POA: Diagnosis not present

## 2023-01-12 DIAGNOSIS — L814 Other melanin hyperpigmentation: Secondary | ICD-10-CM | POA: Diagnosis not present

## 2023-01-12 DIAGNOSIS — L821 Other seborrheic keratosis: Secondary | ICD-10-CM | POA: Diagnosis not present

## 2023-01-12 DIAGNOSIS — D1801 Hemangioma of skin and subcutaneous tissue: Secondary | ICD-10-CM | POA: Diagnosis not present

## 2023-01-12 DIAGNOSIS — D225 Melanocytic nevi of trunk: Secondary | ICD-10-CM | POA: Diagnosis not present

## 2023-01-12 DIAGNOSIS — L578 Other skin changes due to chronic exposure to nonionizing radiation: Secondary | ICD-10-CM | POA: Diagnosis not present

## 2023-02-09 ENCOUNTER — Telehealth: Payer: Self-pay | Admitting: Family Medicine

## 2023-02-09 NOTE — Telephone Encounter (Signed)
Called pt to sch WTM visit, no answer. Left vm to have pt call back to sch appt

## 2023-06-12 DIAGNOSIS — M13841 Other specified arthritis, right hand: Secondary | ICD-10-CM | POA: Diagnosis not present

## 2023-06-12 DIAGNOSIS — R2231 Localized swelling, mass and lump, right upper limb: Secondary | ICD-10-CM | POA: Diagnosis not present

## 2023-08-10 ENCOUNTER — Ambulatory Visit: Payer: Medicare HMO | Admitting: Family Medicine

## 2023-08-10 DIAGNOSIS — Z0000 Encounter for general adult medical examination without abnormal findings: Secondary | ICD-10-CM | POA: Diagnosis not present

## 2023-08-10 DIAGNOSIS — E2839 Other primary ovarian failure: Secondary | ICD-10-CM | POA: Diagnosis not present

## 2023-08-10 NOTE — Progress Notes (Signed)
 PATIENT CHECK-IN and HEALTH RISK ASSESSMENT QUESTIONNAIRE:  -completed by phone/video for upcoming Medicare Preventive Visit   Pre-Visit Check-in: 1)Vitals (height, wt, BP, etc) - record in vitals section for visit on day of visit Request home vitals (wt, BP, etc.) and enter into vitals, THEN update Vital Signs SmartPhrase below at the top of the HPI. See below.  2)Review and Update Medications, Allergies PMH, Surgeries, Social history in Epic 3)Hospitalizations in the last year with date/reason? no  4)Review and Update Care Team (patient's specialists) in Epic 5) Complete PHQ9 in Epic  6) Complete Fall Screening in Epic 7)Review all Health Maintenance Due and order under PCP if not done.  Medicare Wellness Patient Questionnaire:  Answer theses question about your habits: How often do you have a drink containing alcohol? 2-3 x per week How many drinks containing alcohol do you have on a typical day when you are drinking? 1-2 drinks How often do you have six or more drinks on one occasion?never Have you ever smoked? never Do you use an illicit drugs?n On average, how many days per week do you engage in moderate to strenuous exercise (like a brisk walk)?7 days/week On average, how many minutes do you engage in exercise at this level? 60 minutes, goes to the gym, class, walks twice a day as well - 3-4 miles a day Are you sexually active?  Typical breakfast: not a big breakfast eater Typical lunch: tries to avoid sugars and grains, proteins, veggies Typical snacks: no  Beverages: water, elements, unsweet tea, coffee, seltzer Gets out for social events multiple times per week - friends, family, etc  Answer theses question about your everyday activities: Can you perform most household chores? y Are you deaf or have significant trouble hearing?some if in a crowd - does have tinnitus, saw and audiologist/ent for evaluation Do you feel that you have a problem with memory?n Do you feel safe  at home?y Last dentist visit? Goes to dentist 2x per year 8. Do you have any difficulty performing your everyday activities?n Are you having any difficulty walking, taking medications on your own, and or difficulty managing daily home needs?n Do you have difficulty walking or climbing stairs?n Do you have difficulty dressing or bathing?n Do you have difficulty doing errands alone such as visiting a doctor's office or shopping?n Do you currently have any difficulty preparing food and eating?n Do you currently have any difficulty using the toilet?n Do you have any difficulty managing your finances?n Do you have any difficulties with housekeeping of managing your housekeeping?n   Do you have Advanced Directives in place (Living Will, Healthcare Power or Attorney)? n   Last eye Exam and location? Wears readers, see eye doctor   Do you currently use prescribed or non-prescribed narcotic or opioid pain medications?n  Do you have a history or close family history of breast, ovarian, tubal or peritoneal cancer or a family member with BRCA (breast cancer susceptibility 1 and 2) gene mutations? Sister had breast cancer, gyn did genetic testing for her and all was negative     ----------------------------------------------------------------------------------------------------------------------------------------------------------------------------------------------------------------------  Because this visit was a virtual/telehealth visit, some criteria may be missing or patient reported. Any vitals not documented were not able to be obtained and vitals that have been documented are patient reported.    MEDICARE ANNUAL PREVENTIVE VISIT WITH PROVIDER: (Welcome to [facility], initial annual wellness or annual wellness exam)  Virtual Visit via Video Note  I connected with Isaias Sakai on 08/10/23 by a video enabled  telemedicine application and verified that I am speaking with the correct person  using two identifiers.  Location patient: home Location provider:work or home office Persons participating in the virtual visit: patient, provider  Concerns and/or follow up today: doing well, no concerns.  Lucielle has a PMH of  has a past medical history of Arthritis, Chronic UTI (urinary tract infection), Fibroid, Injury of fifth finger of right hand, Lesion of bladder, and SVT (supraventricular tachycardia) (HCC).   See HM section in Epic for other details of completed HM.    ROS: negative for report of fevers, unintentional weight loss, vision changes, vision loss, hearing loss or change, chest pain, sob, hemoptysis, melena, hematochezia, hematuria, falls, bleeding or bruising, thoughts of suicide or self harm, memory loss  Patient-completed extensive health risk assessment - reviewed and discussed with the patient: See Health Risk Assessment completed with patient prior to the visit either above or in recent phone note. This was reviewed in detailed with the patient today and appropriate recommendations, orders and referrals were placed as needed per Summary below and patient instructions.   Review of Medical History: -PMH, PSH, Family History and current specialty and care providers reviewed and updated and listed below   Patient Care Team: Nelwyn Salisbury, MD as PCP - General   Past Medical History:  Diagnosis Date   Arthritis    fingers   Chronic UTI (urinary tract infection)    Fibroid    Injury of fifth finger of right hand    Lesion of bladder    SVT (supraventricular tachycardia) [facility])     Past Surgical History:  Procedure Laterality Date   CESAREAN SECTION  1986   CESAREAN SECTION W/BTL  1988   COLONOSCOPY  01/22/2018    per Dr. Loreta Ave, benign polyp, repeat in 10 yrs    CYSTOSCOPY W/ RETROGRADES  06/15/2011   Procedure: CYSTOSCOPY WITH RETROGRADE PYELOGRAM;  Surgeon: Milford Cage, MD;  Location: [facility];  Service: Urology;  Laterality:  Bilateral;  (BIL) RPG   CYSTOSCOPY WITH BIOPSY  06/15/2011   Procedure: CYSTOSCOPY WITH BIOPSY;  Surgeon: Milford Cage, MD;  Location: [facility];  Service: Urology;  Laterality: N/A;  BLADDER BIOPSY C-ARM CAMERA     LOOP RECORDER INSERTION N/A 04/06/2017   Procedure: LOOP RECORDER INSERTION;  Surgeon: Marinus Maw, MD;  Location: [facility];  Service: Cardiovascular;  Laterality: N/A;   OPEN REDUCTION INTERNAL FIXATION (ORIF) DISTAL PHALANX Left 12/08/2020   Procedure: OPEN TREATMENT OF LEFT HALLUX DISTAL PHALANX FRACTURE;  Surgeon: Terance Hart, MD;  Location: [facility];  Service: Orthopedics;  Laterality: Left;   SALPINGOOPHORECTOMY  1991   LEFT   SVT ABLATION N/A 05/24/2017   Procedure: SVT ABLATION;  Surgeon: Marinus Maw, MD;  Location: [facility];  Service: Cardiovascular;  Laterality: N/A;    Social History   Socioeconomic History   Marital status: Divorced    Spouse name: Not on file   Number of children: 2   Years of education: Not on file   Highest education level: Bachelor's degree (e.g., BA, AB, BS)  Occupational History    Comment: Real Therapist, occupational  Tobacco Use   Smoking status: Never   Smokeless tobacco: Never  Vaping Use   Vaping status: Never Used  Substance and Sexual Activity   Alcohol use: Yes    Comment: 3-5   Drug use: No   Sexual activity: Yes  Partners: Male    Birth control/protection: Surgical    Comment: Tubal Ligation  Other Topics Concern   Not on file  Social History Narrative   Not on file   Social Drivers of Health   Financial Resource Strain: Low Risk  (08/09/2023)   Overall Financial Resource Strain (CARDIA)    Difficulty of Paying Living Expenses: Not hard at all  Food Insecurity: No Food Insecurity (08/09/2023)   Hunger Vital Sign    Worried About Running Out of Food in the Last Year: Never true    Ran Out of Food in the Last Year: Never true  Transportation  Needs: No Transportation Needs (08/09/2023)   PRAPARE - Administrator, Civil Service (Medical): No    Lack of Transportation (Non-Medical): No  Physical Activity: Sufficiently Active (08/09/2023)   Exercise Vital Sign    Days of Exercise per Week: 7 days    Minutes of Exercise per Session: 60 min  Stress: Stress Concern Present (08/09/2023)   [employer] of Occupational Health - Occupational Stress Questionnaire    Feeling of Stress : To some extent  Social Connections: Unknown (08/09/2023)   Social Connection and Isolation Panel [NHANES]    Frequency of Communication with Friends and Family: More than three times a week    Frequency of Social Gatherings with Friends and Family: More than three times a week    Attends Religious Services: Patient declined    Database administrator or Organizations: Yes    Attends Banker Meetings: Patient declined    Marital Status: Divorced  Catering manager Violence: Unknown (09/10/2021)   Received from [employer], [facility]   HITS    Physically Hurt: Not on file    Insult or Talk Down To: Not on file    Threaten Physical Harm: Not on file    Scream or Curse: Not on file    Family History  Problem Relation Age of Onset   Osteoporosis Mother    Prostate cancer Father 10       metastatic   Breast cancer Sister 71   Heart attack Paternal Aunt        late 27's early 51's   Breast cancer Paternal Aunt 53 - 50       bilateral, metastatic   Diabetes Maternal Grandmother    Heart attack Maternal Grandmother     Current Outpatient Medications on File Prior to Visit  Medication Sig Dispense Refill   atorvastatin (LIPITOR) 10 MG tablet Take 1 tablet (10 mg total) by mouth daily. 90 tablet 3   Cyanocobalamin (B-12 PO) Take by mouth.     No current facility-administered medications on file prior to visit.    Allergies  Allergen Reactions   Darvon Nausea And Vomiting   Macrodantin [Nitrofurantoin] Nausea Only    Propoxyphene Nausea And Vomiting       Physical Exam Vitals requested from patient and listed below if patient had equipment and was able to obtain at home for this virtual visit: There were no vitals filed for this visit. Estimated body mass index is 24.58 kg/m as calculated from the following:   Height as of 09/08/22: 5' 5.5" (1.664 m).   Weight as of 09/08/22: 150 lb (68 kg).  EKG (optional): deferred due to virtual visit  GENERAL: alert, oriented, no acute distress detected, full vision exam deferred due to pandemic and/or virtual encounter  HEENT: atraumatic, conjunttiva clear, no obvious abnormalities on inspection of external nose  and ears  NECK: normal movements of the head and neck  LUNGS: on inspection no signs of respiratory distress, breathing rate appears normal, no obvious gross SOB, gasping or wheezing  CV: no obvious cyanosis  MS: moves all visible extremities without noticeable abnormality  PSYCH/NEURO: pleasant and cooperative, no obvious depression or anxiety, speech and thought processing grossly intact, Cognitive function grossly intact  Flowsheet Row Office Visit from 09/08/2022 in [facility] [place]  PHQ-9 Total Score 0           08/10/2023    3:04 PM 09/08/2022   11:39 AM 12/27/2021    1:20 PM  Depression screen PHQ 2/9  Decreased Interest 0 0 0  Down, Depressed, Hopeless 0 0 0  PHQ - 2 Score 0 0 0  Altered sleeping  0   Tired, decreased energy  0   Change in appetite  0   Feeling bad or failure about yourself   0   Trouble concentrating  0   Moving slowly or fidgety/restless  0   Suicidal thoughts  0   PHQ-9 Score  0   Difficult doing work/chores  Not difficult at all        12/08/2020    7:53 AM 09/08/2022   11:38 AM 08/10/2023    3:03 PM  Fall Risk  Falls in the past year?  0 0  Was there an injury with Fall?  0 0  Fall Risk Category Calculator  0 0  (RETIRED) Patient Fall Risk Level High fall risk    Patient at Risk  for Falls Due to  No Fall Risks   Fall risk Follow up  Falls evaluation completed Falls evaluation completed;Education provided     SUMMARY AND PLAN:  Encounter for Medicare annual wellness exam  Estrogen deficiency - Plan: DG Bone Density   Discussed applicable health maintenance/preventive health measures and advised and referred or ordered per patient preferences: -Discussed vaccines due, risks vs benefits, advised can get at pharmacy if wishes to do and to let us know if she does so that we can update chart -ordered bone density test after discussion Health Maintenance  Topic Date Due   COVID-19 Vaccine (1) Never done   DEXA SCAN  Never done   INFLUENZA VACCINE  01/05/2023   Pneumonia Vaccine 43+ Years old (1 of 1 - PCV) 10/04/2023 (Originally 03/06/2022)   Zoster Vaccines- Shingrix (1 of 2) 10/04/2023 (Originally 03/06/1975)   Medicare Annual Wellness (AWV)  08/09/2024   MAMMOGRAM  09/27/2024   DTaP/Tdap/Td (3 - Td or Tdap) 03/26/2025   Colonoscopy  04/20/2028   Hepatitis C Screening  Completed   HPV VACCINES  Aged [employer] and counseling on the following was provided based on the above review of health and a plan/checklist for the patient, along with additional information discussed, was provided for the patient in the patient instructions :  -Advised on importance of completing advanced directives, discussed options for completing and provided information in patient instructions as well -Advised and counseled on a healthy lifestyle -Reviewed patient's current diet. Advised and counseled on a whole foods based healthy diet. A summary of a healthy diet was provided in the Patient Instructions. Encouraged to increase protein intake as she estimates is getting about 20 g per day, discussed healthy sources of protein and daily recommendations -reviewed patient's current physical activity level and discussed exercise guidelines for adults. Discussed community resources  and ideas for safe exercise  at home to assist in meeting exercise guideline recommendations in a safe and healthy way.  -Advise yearly dental visits at minimum and regular eye exams -Advised and counseled on alcohol safe limits, risks - advised no more than one alcoholic beverage per day  Follow up: see patient instructions     Patient Instructions  I really enjoyed getting to talk with you today! I am available on Tuesdays and Thursdays for virtual visits if you have any questions or concerns, or if I can be of any further assistance.   CHECKLIST FROM ANNUAL WELLNESS VISIT:  -Follow up (please call to schedule if not scheduled after visit):   -yearly for annual wellness visit with primary care office  Here is a list of your preventive care/health maintenance measures and the plan for each if any are due:  PLAN For any measures below that may be due:  -I sent an order for the bone density test, please call in two weeks if you have not been contacted about scheduling -you can get the vaccines at the pharmacy if you wish, please request copy of proof of receipt to bring to Dr. Fabian Sharp if you do  Health Maintenance  Topic Date Due   COVID-19 Vaccine (1) Never done   DEXA SCAN  Never done   INFLUENZA VACCINE  01/05/2023   Pneumonia Vaccine 52+ Years old (1 of 1 - PCV) 10/04/2023 (Originally 03/06/2022)   Zoster Vaccines- Shingrix (1 of 2) 10/04/2023 (Originally 03/06/1975)   Medicare Annual Wellness (AWV)  08/09/2024   MAMMOGRAM  09/27/2024   DTaP/Tdap/Td (3 - Td or Tdap) 03/26/2025   Colonoscopy  04/20/2028   Hepatitis C Screening  Completed   HPV VACCINES  Aged Out    -See a dentist at least yearly  -Get your eyes checked and then per your eye specialist's recommendations  -Other issues addressed today:   -I have included below further information regarding a healthy whole foods based diet, physical activity guidelines for adults, stress management and opportunities for social  connections. I hope you find this information useful.   -----------------------------------------------------------------------------------------------------------------------------------------------------------------------------------------------------------------------------------------------------------    NUTRITION: -eat real food: lots of colorful vegetables (half the plate) and fruits -5-7 servings of vegetables and fruits per day (fresh or steamed is best), exp. 2 servings of vegetables with lunch and dinner and 2 servings of fruit per day. Berries and greens such as kale and collards are great choices.  -consume on a regular basis:  fresh fruits, fresh veggies, fish, nuts, seeds, healthy oils (such as olive oil, avocado oil), whole grains (make sure for bread/pasta/crackers/etc., that the first ingredient on label contains the word "whole"), legumes. -can eat small amounts of dairy and lean meat (no larger than the palm of your hand), but avoid processed meats such as ham, bacon, lunch meat, etc. -drink water -try to avoid fast food and pre-packaged foods, processed meat, ultra processed foods/beverages (donuts, candy, etc.) -most experts advise limiting sodium to < 2300mg  per day, should limit further is any chronic conditions such as high blood pressure, heart disease, diabetes, etc. The American Heart Association advised that < 1500mg  is is ideal -try to avoid foods/beverages that contain any ingredients with names you do not recognize  -try to avoid foods/beverages  with added sugar or sweeteners/sweets  -try to avoid sweet drinks (including diet drinks): soda, juice, Gatorade, sweet tea, power drinks, diet drinks -try to avoid white rice, white bread, pasta (unless whole grain)  EXERCISE GUIDELINES FOR ADULTS: -if you wish to  increase your physical activity, do so gradually and with the approval of your doctor -STOP and seek medical care immediately if you have any chest pain, chest  discomfort or trouble breathing when starting or increasing exercise  -move and stretch your body, legs, feet and arms when sitting for long periods -Physical activity guidelines for optimal health in adults: -get at least 150 minutes per week of moderate exercise (can talk, but not sing); this is about 20-30 minutes of sustained activity 5-7 days per week or two 10-15 minute episodes of sustained activity 5-7 days per week -do some muscle building/resistance training/strength training at least 2 days per week  -balance exercises 3+ days per week:   Stand somewhere where you have something sturdy to hold onto if you lose balance    1) lift up on toes, then back down, start with 5x per day and work up to 20x   2) stand and lift one leg straight out to the side so that foot is a few inches of the floor, start with 5x each side and work up to 20x each side   3) stand on one foot, start with 5 seconds each side and work up to 20 seconds on each side  If you need ideas or help with getting more active:  -Silver sneakers https://tools.silversneakers.com  -Walk with a Doc: http://www.duncan-williams.com/  -try to include resistance (weight lifting/strength building) and balance exercises twice per week: or the following link for ideas: http://castillo-powell.com/  BuyDucts.dk  STRESS MANAGEMENT: -can try meditating, or just sitting quietly with deep breathing while intentionally relaxing all parts of your body for 5 minutes daily -if you need further help with stress, anxiety or depression please follow up with your primary doctor or contact the wonderful folks at [facility]: [phone]  SOCIAL CONNECTIONS: -options in [place] if you wish to engage in more social and exercise related activities:  -Silver sneakers https://tools.silversneakers.com  -Walk with a  Doc: http://www.duncan-williams.com/  -Check out the [facility] Active Adults 50+ section on the [place] of [employer] (hiking clubs, book clubs, cards and games, chess, exercise classes, aquatic classes and much more) - see the website for details: https://www.-[place].gov/departments/parks-recreation/active-adults50  -YouTube has lots of exercise videos for different ages and abilities as well  -[facility] (a variety of indoor and outdoor inperson activities for adults). [phone]. [address]  -Virtual Online Classes (a variety of topics): see seniorplanet.org or call (726) [phone]  -consider volunteering at a school, hospice center, church, senior center or elsewhere   ADVANCED HEALTHCARE DIRECTIVES:  [place] Advanced Directives assistance:   ExpressWeek.com.cy  Everyone should have advanced health care directives in place. This is so that you get the care you want, should you ever be in a situation where you are unable to make your own medical decisions.   From the [place] Advanced Directive Website: "Advance Health Care Directives are legal documents in which you give written instructions about your health care if, in the future, you cannot speak for yourself.   A health care power of attorney allows you to name a person you trust to make your health care decisions if you cannot make them yourself. A declaration of a desire for a natural death (or living will) is document, which states that you desire not to have your life prolonged by extraordinary measures if you have a terminal or incurable illness or if you are in a vegetative state. An advance instruction for mental health treatment makes a declaration of  instructions, information and preferences regarding your mental health treatment. It also states that you are aware that the advance instruction authorizes a mental health treatment provider to act  according to your wishes. It may also outline your consent or refusal of mental health treatment. A declaration of an anatomical gift allows anyone over the age of 36 to make a gift by will, organ donor card or other document."   Please see the following website or an elder law attorney for forms, FAQs and for completion of advanced directives: [place] TEFL teacher Health Care Directives Advance Health Care Directives (http://guzman.com/)  Or copy and paste the following to your web browser: PoshChat.fi      Terressa Koyanagi, DO

## 2023-08-10 NOTE — Patient Instructions (Signed)
 I really enjoyed getting to talk with you today! I am available on Tuesdays and Thursdays for virtual visits if you have any questions or concerns, or if I can be of any further assistance.   CHECKLIST FROM ANNUAL WELLNESS VISIT:  -Follow up (please call to schedule if not scheduled after visit):   -yearly for annual wellness visit with primary care office  Here is a list of your preventive care/health maintenance measures and the plan for each if any are due:  PLAN For any measures below that may be due:  -I sent an order for the bone density test, please call in two weeks if you have not been contacted about scheduling -you can get the vaccines at the pharmacy if you wish, please request copy of proof of receipt to bring to Dr. Fabian Sharp if you do  Health Maintenance  Topic Date Due   COVID-19 Vaccine (1) Never done   DEXA SCAN  Never done   INFLUENZA VACCINE  01/05/2023   Pneumonia Vaccine 46+ Years old (1 of 1 - PCV) 10/04/2023 (Originally 03/06/2022)   Zoster Vaccines- Shingrix (1 of 2) 10/04/2023 (Originally 03/06/1975)   Medicare Annual Wellness (AWV)  08/09/2024   MAMMOGRAM  09/27/2024   DTaP/Tdap/Td (3 - Td or Tdap) 03/26/2025   Colonoscopy  04/20/2028   Hepatitis C Screening  Completed   HPV VACCINES  Aged Out    -See a dentist at least yearly  -Get your eyes checked and then per your eye specialist's recommendations  -Other issues addressed today:   -I have included below further information regarding a healthy whole foods based diet, physical activity guidelines for adults, stress management and opportunities for social connections. I hope you find this information useful.   -----------------------------------------------------------------------------------------------------------------------------------------------------------------------------------------------------------------------------------------------------------    NUTRITION: -eat real food: lots of  colorful vegetables (half the plate) and fruits -5-7 servings of vegetables and fruits per day (fresh or steamed is best), exp. 2 servings of vegetables with lunch and dinner and 2 servings of fruit per day. Berries and greens such as kale and collards are great choices.  -consume on a regular basis:  fresh fruits, fresh veggies, fish, nuts, seeds, healthy oils (such as olive oil, avocado oil), whole grains (make sure for bread/pasta/crackers/etc., that the first ingredient on label contains the word "whole"), legumes. -can eat small amounts of dairy and lean meat (no larger than the palm of your hand), but avoid processed meats such as ham, bacon, lunch meat, etc. -drink water -try to avoid fast food and pre-packaged foods, processed meat, ultra processed foods/beverages (donuts, candy, etc.) -most experts advise limiting sodium to < 2300mg  per day, should limit further is any chronic conditions such as high blood pressure, heart disease, diabetes, etc. The American Heart Association advised that < 1500mg  is is ideal -try to avoid foods/beverages that contain any ingredients with names you do not recognize  -try to avoid foods/beverages  with added sugar or sweeteners/sweets  -try to avoid sweet drinks (including diet drinks): soda, juice, Gatorade, sweet tea, power drinks, diet drinks -try to avoid white rice, white bread, pasta (unless whole grain)  EXERCISE GUIDELINES FOR ADULTS: -if you wish to increase your physical activity, do so gradually and with the approval of your doctor -STOP and seek medical care immediately if you have any chest pain, chest discomfort or trouble breathing when starting or increasing exercise  -move and stretch your body, legs, feet and arms when sitting for long periods -Physical activity guidelines for optimal health  in adults: -get at least 150 minutes per week of moderate exercise (can talk, but not sing); this is about 20-30 minutes of sustained activity 5-7 days  per week or two 10-15 minute episodes of sustained activity 5-7 days per week -do some muscle building/resistance training/strength training at least 2 days per week  -balance exercises 3+ days per week:   Stand somewhere where you have something sturdy to hold onto if you lose balance    1) lift up on toes, then back down, start with 5x per day and work up to 20x   2) stand and lift one leg straight out to the side so that foot is a few inches of the floor, start with 5x each side and work up to 20x each side   3) stand on one foot, start with 5 seconds each side and work up to 20 seconds on each side  If you need ideas or help with getting more active:  -Silver sneakers https://tools.silversneakers.com  -Walk with a Doc: http://www.duncan-williams.com/  -try to include resistance (weight lifting/strength building) and balance exercises twice per week: or the following link for ideas: http://castillo-powell.com/  BuyDucts.dk  STRESS MANAGEMENT: -can try meditating, or just sitting quietly with deep breathing while intentionally relaxing all parts of your body for 5 minutes daily -if you need further help with stress, anxiety or depression please follow up with your primary doctor or contact the wonderful folks at [facility]: [phone]  SOCIAL CONNECTIONS: -options in [place] if you wish to engage in more social and exercise related activities:  -Silver sneakers https://tools.silversneakers.com  -Walk with a Doc: http://www.duncan-williams.com/  -Check out the [facility] Active Adults 50+ section on the [place] of [employer] (hiking clubs, book clubs, cards and games, chess, exercise classes, aquatic classes and much more) - see the website for details: https://www.[place].gov/departments/parks-recreation/active-adults50  -YouTube has lots of exercise videos for different  ages and abilities as well  -[facility] (a variety of indoor and outdoor inperson activities for adults). [phone]. [address]  -Virtual Online Classes (a variety of topics): see seniorplanet.org or call [phone]  -consider volunteering at a school, hospice center, church, senior center or elsewhere   ADVANCED HEALTHCARE DIRECTIVES:  [place] Advanced Directives assistance:   ExpressWeek.com.cy  Everyone should have advanced health care directives in place. This is so that you get the care you want, should you ever be in a situation where you are unable to make your own medical decisions.   From the [place] Advanced Directive Website: "Advance Health Care Directives are legal documents in which you give written instructions about your health care if, in the future, you cannot speak for yourself.   A health care power of attorney allows you to name a person you trust to make your health care decisions if you cannot make them yourself. A declaration of a desire for a natural death (or living will) is document, which states that you desire not to have your life prolonged by extraordinary measures if you have a terminal or incurable illness or if you are in a vegetative state. An advance instruction for mental health treatment makes a declaration of instructions, information and preferences regarding your mental health treatment. It also states that you are aware that the advance instruction authorizes a mental health treatment provider to act according to your wishes. It may also outline your consent or refusal of mental health treatment. A declaration of an anatomical gift allows anyone over the age of 69  to make a gift by will, organ donor card or other document."   Please see the following website or an elder law attorney for forms, FAQs and for completion of advanced directives: [place] [employer] Health Care Directives Advance Health Care Directives (http://guzman.com/)  Or copy and paste the following to your web browser: PoshChat.fi

## 2023-10-09 ENCOUNTER — Encounter: Payer: Self-pay | Admitting: Obstetrics & Gynecology

## 2023-10-09 ENCOUNTER — Other Ambulatory Visit: Payer: Self-pay | Admitting: Obstetrics & Gynecology

## 2023-10-09 DIAGNOSIS — Z1231 Encounter for screening mammogram for malignant neoplasm of breast: Secondary | ICD-10-CM

## 2023-10-24 ENCOUNTER — Ambulatory Visit
Admission: RE | Admit: 2023-10-24 | Discharge: 2023-10-24 | Disposition: A | Discharge status: Home / Self Care | Source: Ambulatory Visit | Attending: Obstetrics & Gynecology | Admitting: Obstetrics & Gynecology

## 2023-10-24 DIAGNOSIS — Z1231 Encounter for screening mammogram for malignant neoplasm of breast: Secondary | ICD-10-CM

## 2023-12-20 DIAGNOSIS — J209 Acute bronchitis, unspecified: Secondary | ICD-10-CM | POA: Diagnosis not present

## 2024-01-04 ENCOUNTER — Encounter (HOSPITAL_BASED_OUTPATIENT_CLINIC_OR_DEPARTMENT_OTHER): Payer: Self-pay

## 2024-01-25 DIAGNOSIS — D1801 Hemangioma of skin and subcutaneous tissue: Secondary | ICD-10-CM | POA: Diagnosis not present

## 2024-01-25 DIAGNOSIS — D225 Melanocytic nevi of trunk: Secondary | ICD-10-CM | POA: Diagnosis not present

## 2024-01-25 DIAGNOSIS — D2272 Melanocytic nevi of left lower limb, including hip: Secondary | ICD-10-CM | POA: Diagnosis not present

## 2024-01-25 DIAGNOSIS — D2271 Melanocytic nevi of right lower limb, including hip: Secondary | ICD-10-CM | POA: Diagnosis not present

## 2024-01-25 DIAGNOSIS — L821 Other seborrheic keratosis: Secondary | ICD-10-CM | POA: Diagnosis not present

## 2024-01-25 DIAGNOSIS — L578 Other skin changes due to chronic exposure to nonionizing radiation: Secondary | ICD-10-CM | POA: Diagnosis not present

## 2024-01-25 DIAGNOSIS — L814 Other melanin hyperpigmentation: Secondary | ICD-10-CM | POA: Diagnosis not present

## 2024-02-19 ENCOUNTER — Ambulatory Visit (HOSPITAL_BASED_OUTPATIENT_CLINIC_OR_DEPARTMENT_OTHER): Admitting: Obstetrics & Gynecology

## 2024-03-20 ENCOUNTER — Other Ambulatory Visit (HOSPITAL_COMMUNITY)
Admission: RE | Admit: 2024-03-20 | Discharge: 2024-03-20 | Disposition: A | Discharge status: Home / Self Care | Source: Ambulatory Visit | Attending: Obstetrics & Gynecology | Admitting: Obstetrics & Gynecology

## 2024-03-20 ENCOUNTER — Ambulatory Visit (HOSPITAL_BASED_OUTPATIENT_CLINIC_OR_DEPARTMENT_OTHER): Admitting: Obstetrics & Gynecology

## 2024-03-20 ENCOUNTER — Encounter (HOSPITAL_BASED_OUTPATIENT_CLINIC_OR_DEPARTMENT_OTHER): Payer: Self-pay | Admitting: Obstetrics & Gynecology

## 2024-03-20 VITALS — BP 118/66 | HR 80 | Ht 67.0 in | Wt 150.8 lb

## 2024-03-20 DIAGNOSIS — Z01419 Encounter for gynecological examination (general) (routine) without abnormal findings: Secondary | ICD-10-CM

## 2024-03-20 DIAGNOSIS — E2839 Other primary ovarian failure: Secondary | ICD-10-CM

## 2024-03-20 DIAGNOSIS — Z8679 Personal history of other diseases of the circulatory system: Secondary | ICD-10-CM

## 2024-03-20 DIAGNOSIS — Z113 Encounter for screening for infections with a predominantly sexual mode of transmission: Secondary | ICD-10-CM | POA: Diagnosis not present

## 2024-03-20 DIAGNOSIS — Z803 Family history of malignant neoplasm of breast: Secondary | ICD-10-CM

## 2024-03-20 DIAGNOSIS — D251 Intramural leiomyoma of uterus: Secondary | ICD-10-CM | POA: Diagnosis not present

## 2024-03-20 NOTE — Patient Instructions (Addendum)
 Call [phone] to schedule an appointment at [facility].    Check your Vit D dosage.  1000 -2000IU daily is recommended.  Pneumonia:  Prevnar 20

## 2024-03-20 NOTE — Progress Notes (Signed)
 Breast and Pelvic Exam Patient name: Kimberly Wheeler MRN [id number]  Date of birth: [birth year] Chief Complaint:   Gynecologic Exam (Pt reports no issues or concerns today.)  History of Present Illness:   Kimberly Wheeler is a 68 y.o. G70P2002 Caucasian female being seen today for breast and pelvic exam.  No concerns today.  Denies vaginal bleeding.    Family history of breast cancer in her sister.  Pt had genetic testing that was negative.  Tyrer Cusick model showed intermediate risk for breast cancer right at 15%.  Grade C breast density.    Desires STI testing today.  Patient's last menstrual period was 03/07/2011.  Last pap 12/27/2021. Results were: NILM w/ HRHPV negative. H/O abnormal pap: no Last mammogram: 10/24/2023. Results were: normal. Family h/o breast cancer: yes sister. Last colonoscopy: 04/20/2018. Results were: abnormal 2 polyps.  Hyperplastic polyp.  F/u 10 years.  Family h/o colorectal cancer: no DEXA:  never done.  Discussed today.       03/20/2024    8:57 AM 08/10/2023    3:04 PM 09/08/2022   11:39 AM 12/27/2021    1:20 PM  Depression screen PHQ 2/9  Decreased Interest 0 0 0 0  Down, Depressed, Hopeless 0 0 0 0  PHQ - 2 Score 0 0 0 0  Altered sleeping   0   Tired, decreased energy   0   Change in appetite   0   Feeling bad or failure about yourself    0   Trouble concentrating   0   Moving slowly or fidgety/restless   0   Suicidal thoughts   0   PHQ-9 Score   0   Difficult doing work/chores   Not difficult at all         09/08/2022   11:38 AM  GAD 7 : Generalized Anxiety Score  Nervous, Anxious, on Edge 0  Control/stop worrying 0  Worry too much - different things 0  Trouble relaxing 0  Restless 0  Easily annoyed or irritable 0  Afraid - awful might happen 0  Total GAD 7 Score 0  Anxiety Difficulty Not difficult at all     Review of Systems:   Pertinent items are noted in HPI Denies any bowel or bladder changes.  Denies pelvic pain.   Pertinent  History Reviewed:  Reviewed past medical,surgical, social and family history.  Reviewed problem list, medications and allergies. Physical Assessment:   Vitals:   03/20/24 0854  BP: 118/66  Pulse: 80  SpO2: 100%  Weight: 150 lb 12.8 oz (68.4 kg)  Height: 5' 7 (1.702 m)  Body mass index is 23.62 kg/m.        Physical Examination:   General appearance - well appearing, and in no distress  Mental status - alert, oriented to person, place, and time  Psych:  She has a normal mood and affect  Skin - warm and dry, normal color, no suspicious lesions noted  Chest - effort normal, all lung fields clear to auscultation bilaterally  Heart - normal rate and regular rhythm  Neck:  midline trachea, no thyromegaly or nodules  Breasts - breasts appear normal, no suspicious masses, no skin or nipple changes or  axillary nodes  Abdomen - soft, nontender, nondistended, no masses or organomegaly  Pelvic - VULVA: normal appearing vulva with no masses, tenderness or lesions   VAGINA: normal appearing vagina with normal color and discharge, no lesions   CERVIX: normal appearing cervix without  discharge or lesions, no CMT  Thin prep pap is not indicated today  UTERUS: uterus is felt to be normal size, shape, consistency and nontender   ADNEXA: No adnexal masses or tenderness noted.  Rectal - normal rectal, good sphincter tone, no masses felt  Extremities:  No swelling or varicosities noted  Chaperone present for exam  No results found for this or any previous visit (from the past 24 hours).  Assessment & Plan:  1. Encntr for gyn exam (general) (routine) w/o abn findings (Primary) - Pap smear 2023 - Mammogram 10/2023 - Colonoscopy 2019.  F/u 10 years. - Bone mineral density ordered - lab work done with PCP. - vaccines reviewed/updated  2. Hypoestrogenism - DG Bone Density; Future - discussed Vit D dosage  3. History of cardiovascular disorder  4. Family history of breast cancer - discussed  contrast enhanced mammogram with next screening mammogram  5. Intramural leiomyoma of uterus  6. Screen for STI - Cervicovaginal ancillary only( [place]) - RPR+HBsAg+HIV - Hepatitis C antibody   Meds: No orders of the defined types were placed in this encounter.   Follow-up: 1-2 years  Ronal GORMAN [place], MD 03/20/2024 9:12 AM

## 2024-03-21 ENCOUNTER — Ambulatory Visit (HOSPITAL_BASED_OUTPATIENT_CLINIC_OR_DEPARTMENT_OTHER): Payer: Self-pay | Admitting: Obstetrics & Gynecology

## 2024-03-21 LAB — CERVICOVAGINAL ANCILLARY ONLY
Chlamydia: NEGATIVE
Comment: NEGATIVE
Comment: NEGATIVE
Comment: NORMAL
Neisseria Gonorrhea: NEGATIVE
Trichomonas: NEGATIVE

## 2024-03-21 LAB — RPR+HBSAG+HIV
HIV Screen 4th Generation wRfx: NONREACTIVE
Hepatitis B Surface Ag: NEGATIVE
RPR Ser Ql: NONREACTIVE

## 2024-03-21 LAB — HEPATITIS C ANTIBODY: Hep C Virus Ab: NONREACTIVE

## 2024-04-16 DIAGNOSIS — J019 Acute sinusitis, unspecified: Secondary | ICD-10-CM | POA: Diagnosis not present
